# Patient Record
Sex: Female | Born: 1980 | Race: White | Hispanic: No | Marital: Single | Smoking: Never smoker
Health system: Southern US, Community
[De-identification: ages and names within clinical notes are randomized; demographics above are authoritative.]

## PROBLEM LIST (undated history)

## (undated) DIAGNOSIS — Z993 Dependence on wheelchair: Secondary | ICD-10-CM

## (undated) DIAGNOSIS — G709 Myoneural disorder, unspecified: Secondary | ICD-10-CM

## (undated) DIAGNOSIS — R51 Headache: Secondary | ICD-10-CM

## (undated) DIAGNOSIS — K219 Gastro-esophageal reflux disease without esophagitis: Secondary | ICD-10-CM

## (undated) DIAGNOSIS — I739 Peripheral vascular disease, unspecified: Secondary | ICD-10-CM

## (undated) DIAGNOSIS — N189 Chronic kidney disease, unspecified: Secondary | ICD-10-CM

## (undated) DIAGNOSIS — F419 Anxiety disorder, unspecified: Secondary | ICD-10-CM

## (undated) DIAGNOSIS — Q039 Congenital hydrocephalus, unspecified: Secondary | ICD-10-CM

## (undated) DIAGNOSIS — N3090 Cystitis, unspecified without hematuria: Secondary | ICD-10-CM

## (undated) DIAGNOSIS — J45909 Unspecified asthma, uncomplicated: Secondary | ICD-10-CM

## (undated) DIAGNOSIS — Q059 Spina bifida, unspecified: Secondary | ICD-10-CM

## (undated) DIAGNOSIS — R519 Headache, unspecified: Secondary | ICD-10-CM

## (undated) DIAGNOSIS — R569 Unspecified convulsions: Secondary | ICD-10-CM

## (undated) HISTORY — PX: SPINE SURGERY: SHX786

## (undated) HISTORY — PX: WISDOM TOOTH EXTRACTION: SHX21

## (undated) HISTORY — DX: Congenital hydrocephalus, unspecified: Q03.9

## (undated) HISTORY — DX: Spina bifida, unspecified: Q05.9

## (undated) HISTORY — PX: SHUNT REVISION: SHX343

## (undated) HISTORY — PX: TONSILLECTOMY AND ADENOIDECTOMY: SHX28

## (undated) HISTORY — PX: BACK SURGERY: SHX140

## (undated) HISTORY — PX: APPENDECTOMY: SHX54

## (undated) HISTORY — PX: TONSILLECTOMY AND ADENOIDECTOMY: SUR1326

## (undated) HISTORY — PX: VENTRICULOPERITONEAL SHUNT: SHX204

## (undated) HISTORY — DX: Unspecified asthma, uncomplicated: J45.909

## (undated) HISTORY — DX: Anxiety disorder, unspecified: F41.9

## (undated) HISTORY — PX: OTHER SURGICAL HISTORY: SHX169

## (undated) HISTORY — PX: ABDOMINAL HYSTERECTOMY: SHX81

---

## 2001-04-26 ENCOUNTER — Ambulatory Visit (HOSPITAL_COMMUNITY): Admission: RE | Admit: 2001-04-26 | Discharge: 2001-04-26 | Payer: Self-pay | Admitting: Oral Surgery

## 2001-10-24 ENCOUNTER — Encounter: Payer: Self-pay | Admitting: Family Medicine

## 2001-10-24 ENCOUNTER — Ambulatory Visit (HOSPITAL_COMMUNITY): Admission: RE | Admit: 2001-10-24 | Discharge: 2001-10-24 | Payer: Self-pay | Admitting: Family Medicine

## 2005-11-02 ENCOUNTER — Other Ambulatory Visit: Admission: RE | Admit: 2005-11-02 | Discharge: 2005-11-02 | Payer: Self-pay | Admitting: Family Medicine

## 2006-10-12 ENCOUNTER — Other Ambulatory Visit: Admission: RE | Admit: 2006-10-12 | Discharge: 2006-10-12 | Payer: Self-pay | Admitting: Family Medicine

## 2007-04-22 ENCOUNTER — Other Ambulatory Visit: Admission: RE | Admit: 2007-04-22 | Discharge: 2007-04-22 | Payer: Self-pay | Admitting: Family Medicine

## 2007-06-28 ENCOUNTER — Encounter: Admission: RE | Admit: 2007-06-28 | Discharge: 2007-09-26 | Payer: Self-pay | Admitting: Family Medicine

## 2007-10-25 ENCOUNTER — Other Ambulatory Visit: Admission: RE | Admit: 2007-10-25 | Discharge: 2007-10-25 | Payer: Self-pay | Admitting: Family Medicine

## 2008-11-06 ENCOUNTER — Encounter: Admission: RE | Admit: 2008-11-06 | Discharge: 2008-11-06 | Payer: Self-pay | Admitting: Family Medicine

## 2008-11-07 ENCOUNTER — Emergency Department (HOSPITAL_COMMUNITY): Admission: EM | Admit: 2008-11-07 | Discharge: 2008-11-07 | Payer: Self-pay | Admitting: Emergency Medicine

## 2009-06-14 ENCOUNTER — Other Ambulatory Visit: Admission: RE | Admit: 2009-06-14 | Discharge: 2009-06-14 | Payer: Self-pay | Admitting: Family Medicine

## 2009-09-09 ENCOUNTER — Ambulatory Visit (HOSPITAL_COMMUNITY): Admission: RE | Admit: 2009-09-09 | Discharge: 2009-09-10 | Payer: Self-pay | Admitting: Obstetrics and Gynecology

## 2009-09-09 ENCOUNTER — Encounter (INDEPENDENT_AMBULATORY_CARE_PROVIDER_SITE_OTHER): Payer: Self-pay | Admitting: Obstetrics and Gynecology

## 2010-10-26 ENCOUNTER — Encounter: Payer: Self-pay | Admitting: Family Medicine

## 2011-01-06 LAB — TYPE AND SCREEN: ABO/RH(D): B POS

## 2011-01-06 LAB — COMPREHENSIVE METABOLIC PANEL
ALT: 29 U/L (ref 0–35)
AST: 19 U/L (ref 0–37)
Albumin: 3.9 g/dL (ref 3.5–5.2)
Alkaline Phosphatase: 118 U/L — ABNORMAL HIGH (ref 39–117)
BUN: 6 mg/dL (ref 6–23)
CO2: 25 mEq/L (ref 19–32)
Calcium: 9.1 mg/dL (ref 8.4–10.5)
Chloride: 104 mEq/L (ref 96–112)
Creatinine, Ser: 0.36 mg/dL — ABNORMAL LOW (ref 0.4–1.2)
GFR calc Af Amer: >60 mL/min (ref >60)
GFR calc non Af Amer: >60 mL/min (ref >60)
Glucose, Bld: 93 mg/dL (ref 70–99)
Potassium: 3 mEq/L — ABNORMAL LOW (ref 3.5–5.1)
Sodium: 138 mEq/L (ref 135–145)
Total Bilirubin: 0.2 mg/dL — ABNORMAL LOW (ref 0.3–1.2)
Total Protein: 7.8 g/dL (ref 6.0–8.3)

## 2011-01-06 LAB — CBC
HCT: 43.1 % (ref 36.0–46.0)
Hemoglobin: 13.9 g/dL (ref 12.0–15.0)
MCHC: 32.4 g/dL (ref 30.0–36.0)
MCV: 84.4 fL (ref 78.0–100.0)
Platelets: 294 10*3/uL (ref 150–400)
Platelets: 347 10*3/uL (ref 150–400)
RBC: 5.1 MIL/uL (ref 3.87–5.11)
RDW: 14.7 % (ref 11.5–15.5)
RDW: 15 % (ref 11.5–15.5)
WBC: 10.1 10*3/uL (ref 4.0–10.5)
WBC: 10.2 10*3/uL (ref 4.0–10.5)

## 2011-01-06 LAB — PREGNANCY, URINE: Preg Test, Ur: NEGATIVE

## 2011-01-06 LAB — ABO/RH: ABO/RH(D): B POS

## 2011-01-20 LAB — URINALYSIS, ROUTINE W REFLEX MICROSCOPIC
Nitrite: NEGATIVE
Specific Gravity, Urine: 1.01 (ref 1.005–1.030)
Urobilinogen, UA: 0.2 mg/dL (ref 0.0–1.0)
pH: 6.5 (ref 5.0–8.0)

## 2011-02-20 NOTE — Op Note (Signed)
. [facility]  Patient:    Carol Zavala, Carol Zavala                   MRN: [id number] Proc. Date: 04/26/01 Adm. Date:  [id number] Attending:  Retia Passe                           Operative Report  PREOPERATIVE DIAGNOSIS:  Maxillary and mandibular impacted third molar teeth numbers 1, 16, 17 and 32.  History of severe latex allergy.  History of spina bifida.  POSTOPERATIVE DIAGNOSIS:  Maxillary and mandibular impacted third molar teeth numbers 1, 16, 17 and 32.  History of severe latex allergy.  History of spina bifida.  OPERATION PERFORMED:  Removal of impacted third molar teeth numbers 1, 16, 17 and 32.  SURGEON:  Vania Rea. Warren Danes, D.D.S.  ASSISTANT:  Tinnie Gens.  ANESTHESIA:  General orotracheal.  ESTIMATED BLOOD LOSS:  Less than 20 cc.  FLUID REPLACEMENT:  Approximately 1000 cc crystalloid solution.  COMPLICATIONS:  None apparent.  INDICATIONS FOR PROCEDURE:  The patient is a 30 year old who was referred to my office for removal of her third molar teeth.  The patient presents with a history of spinal bifida, cardiothoracic shunt, and a history of severe latex allergy.  Due to her medical history, it was recommended that the procedure be performed in an operating room situation.  DESCRIPTION OF PROCEDURE:  On April 26, 2001, the patient was taken to the [facility] operating suite where she was placed on the operating room table in a supine position.  Following successful orotracheal intubation and general anesthesia, the patients face, neck and oral cavity were prepped and drift in the usual sterile operating room fashion.  The hypopharynx was suctioned free of fluids and secretions and a moistened 2 inch vaginal pack was placed as a throat pack.  Attention was then directed intraorally, where approximately 10 cc of 0.5% Xylocaine containing 1:200,000 epinephrine were infiltrated in the right and left maxillary  posterior superior alveolar nerve distributions, right and left palatal soft tissues and the right and left inferior alveolar neurovascular regions.  Attention was then directed towards the maxillary arches, where a #15 Bard Parker blade was used to create a 1.5 curvilinear incision over tooth #1.  A full thickness mucoperiosteal flap was elevated laterally exposing the imbedded tooth.  The tooth was sectioned in its vertical axes using a Stryker rotary osteotome and a 7-0 fissure bur.  The tooth was then subluxated and removed from the oral cavity using an 11A elevator and rongeurs and forceps.  The bony margins  were smoothed and contoured with a small osseous file and the surrounding dental follicular tissue was curetted using double ended Molt curet.  The surgical area was then thoroughly irrigated with sterile saline irrigating solution and suctioned.  The mucoperiosteal margins were approximated and sutured in an interrupted fashion using 4-0 chromic suture material on a PS2 needle.  In a similar fashion, the remaining third molar teeth were removed as well.  The throat pack was removed, and the hypopharynx suctioned free of fluid and secretions.  The patient was allowed to awaken from the anesthesia and taken to the recovery room where she tolerated the procedure well and without apparent complications. DD:  04/26/01 TD:  04/26/01 Job: [id number]

## 2011-07-10 ENCOUNTER — Other Ambulatory Visit (HOSPITAL_COMMUNITY)
Admission: RE | Admit: 2011-07-10 | Discharge: 2011-07-10 | Disposition: A | Discharge status: Home / Self Care | Payer: BC Managed Care – PPO | Source: Ambulatory Visit | Attending: Obstetrics and Gynecology | Admitting: Obstetrics and Gynecology

## 2011-07-10 ENCOUNTER — Other Ambulatory Visit: Payer: Self-pay | Admitting: Obstetrics and Gynecology

## 2011-07-10 DIAGNOSIS — Z01419 Encounter for gynecological examination (general) (routine) without abnormal findings: Secondary | ICD-10-CM | POA: Insufficient documentation

## 2013-08-16 ENCOUNTER — Ambulatory Visit (INDEPENDENT_AMBULATORY_CARE_PROVIDER_SITE_OTHER): Payer: BC Managed Care – PPO | Admitting: Family Medicine

## 2013-08-16 VITALS — BP 108/70 | HR 110 | Temp 98.2°F | Resp 20

## 2013-08-16 DIAGNOSIS — R51 Headache: Secondary | ICD-10-CM

## 2013-08-16 DIAGNOSIS — R350 Frequency of micturition: Secondary | ICD-10-CM

## 2013-08-16 DIAGNOSIS — R42 Dizziness and giddiness: Secondary | ICD-10-CM

## 2013-08-16 DIAGNOSIS — R000 Tachycardia, unspecified: Secondary | ICD-10-CM

## 2013-08-16 LAB — POCT CBC
HCT, POC: 45.2 % (ref 37.7–47.9)
Hemoglobin: 13.7 g/dL (ref 12.2–16.2)
Lymph, poc: 3.2 (ref 0.6–3.4)
MCH, POC: 27.8 pg (ref 27–31.2)
MCHC: 30.3 g/dL — AB (ref 31.8–35.4)
MCV: 91.6 fL (ref 80–97)
POC LYMPH PERCENT: 31.2 %L (ref 10–50)
WBC: 10.2 10*3/uL (ref 4.6–10.2)

## 2013-08-16 LAB — POCT UA - MICROSCOPIC ONLY
Casts, Ur, LPF, POC: NEGATIVE
Crystals, Ur, HPF, POC: NEGATIVE
Mucus, UA: NEGATIVE

## 2013-08-16 LAB — POCT URINALYSIS DIPSTICK
Glucose, UA: NEGATIVE
Spec Grav, UA: 1.025
Urobilinogen, UA: 0.2
pH, UA: 5

## 2013-08-16 LAB — GLUCOSE, POCT (MANUAL RESULT ENTRY): POC Glucose: 94 mg/dl (ref 70–99)

## 2013-08-16 NOTE — Progress Notes (Addendum)
[address]   [place], [place]  [address]   [phone]  Subjective:    Patient ID: Carol Zavala, female    DOB: [birth year], 32 y.o.   MRN: [id number]  This chart was scribed for Ethelda Chick, MD by Greggory Stallion, Medical Scribe. This patient's care was started at 7:42 PM.  HPI HPI Comments: Carol Zavala is a 32 y.o. female with history of spina bifida who is wheelchair bound who presents to the office complaining of constant sharp, sinus headache with associated nausea, SOB, chest pain, palpitations and dizziness that started yesterday while she was at work. The headache worsened yesterday, she took Excedrin with relief and it returned today. She rates it at 7/10 yesterday and 4/10 today. Pt states she gets SOB during panic attacks but her SOB does not seem the same. She states she checked her blood pressure and it was 188/80 and a high heart rate. Pt checked it again this morning and her blood pressure decreased but her heart rate was still very high of 120. She states she has not been under increased stress recently but works in a call center with persistent stress. Pt states she has leg swelling sometimes, chronically worse in the right leg. She has been urinating frequently and often gets UTIs; she performs self caths at home. Pt denies emesis, numbness, tingling, weakness, ear pain, sore throat, fever, chills, sweats, blurred vision, double vision, rhinorrhea, congestion, mouth sores, neck pain, cough. Pt has not taken any new medications recently. Father has history of diabetes and irregular heartbeat. Mother has history of HTN.   No family history of strokes; patient has friend who just suffered a CVA.  Attempted to contact PCP but unable to provide patient with appointment today.  Most worried about rapid heart rate.  Headache improved today.  There are no active problems to display for this patient.  Past Medical History  Diagnosis Date   Asthma    Anxiety    Past  Surgical History  Procedure Laterality Date   Appendectomy     Eye surgery     Abdominal hysterectomy     Spine surgery     Tonsillectomy and adenoidectomy     Allergies  Allergen Reactions   Latex Anaphylaxis   Neomycin Anaphylaxis   Ciprofloxacin Other (See Comments)   Suprax [Cefixime] Other (See Comments)   Septra [Sulfamethoxazole-Tmp Ds] Rash   Prior to Admission medications   Not on File   History   Social History   Marital Status: Single    Spouse Name: N/A    Number of Children: N/A   Years of Education: N/A   Occupational History   Not on file.   Social History Main Topics   Smoking status: Never Smoker    Smokeless tobacco: Not on file   Alcohol Use: 0.0 oz/week    4-5 drink(s) per week   Drug Use: No   Sexual Activity: Not on file   Other Topics Concern   Not on file   Social History Narrative   No narrative on file    Review of Systems  Constitutional: Negative for fever, chills and diaphoresis.  HENT: Negative for congestion, ear pain, mouth sores, rhinorrhea and sore throat.   Eyes: Negative for visual disturbance.  Respiratory: Positive for shortness of breath.   Cardiovascular: Positive for chest pain, palpitations and leg swelling.  Gastrointestinal: Positive for nausea. Negative for vomiting.  Genitourinary: Positive for frequency.  Musculoskeletal: Negative for neck  pain.  Neurological: Positive for dizziness and headaches. Negative for weakness and numbness.       Objective:   Physical Exam  Constitutional: She is oriented to person, place, and time. She appears well-developed and well-nourished. No distress.  HENT:  Head: Normocephalic and atraumatic.  Right Ear: Tympanic membrane and ear canal normal.  Left Ear: Tympanic membrane and ear canal normal.  Nose: Nose normal.  Mouth/Throat: Uvula is midline, oropharynx is clear and moist and mucous membranes are normal.  Eyes: Conjunctivae and EOM are normal.  Pupils are equal, round, and reactive to light.  Neck: Neck supple. No tracheal deviation present.  Cardiovascular: Normal rate, regular rhythm and normal heart sounds.   No murmur heard. Rate 98.  Pulmonary/Chest: Effort normal. No respiratory distress.  Abdominal: Soft. Bowel sounds are normal. She exhibits no distension and no mass. There is no tenderness. There is no rebound and no guarding.  Neurological: She is alert and oriented to person, place, and time.  Skin: Skin is warm and dry.  Psychiatric: She has a normal mood and affect. Her behavior is normal.    Filed Vitals:   08/16/13 1905  BP: 116/82  Pulse: 110  Temp: 98.2 F (36.8 C)  TempSrc: Oral  Resp: 20  SpO2: 100%   Results for orders placed in visit on 08/16/13  POCT CBC      Result Value Range   WBC 10.2  4.6 - 10.2 K/uL   Lymph, poc 3.2  0.6 - 3.4   POC LYMPH PERCENT 31.2  10 - 50 %L   MID (cbc) 0.6  0 - 0.9   POC MID % 5.6  0 - 12 %M   POC Granulocyte 6.4  2 - 6.9   Granulocyte percent 63.2  37 - 80 %G   RBC 4.93  4.04 - 5.48 M/uL   Hemoglobin 13.7  12.2 - 16.2 g/dL   HCT, POC 30.8  65.7 - 47.9 %   MCV 91.6  80 - 97 fL   MCH, POC 27.8  27 - 31.2 pg   MCHC 30.3 (*) 31.8 - 35.4 g/dL   RDW, POC 84.6     Platelet Count, POC 332  142 - 424 K/uL   MPV 9.5  0 - 99.8 fL  GLUCOSE, POCT (MANUAL RESULT ENTRY)      Result Value Range   POC Glucose 94  70 - 99 mg/dl  POCT UA - MICROSCOPIC ONLY      Result Value Range   WBC, Ur, HPF, POC 5-8     RBC, urine, microscopic 2-4     Bacteria, U Microscopic small     Mucus, UA neg     Epithelial cells, urine per micros 1-3     Crystals, Ur, HPF, POC neg     Casts, Ur, LPF, POC neg     Yeast, UA neg    POCT URINALYSIS DIPSTICK      Result Value Range   Color, UA yellow     Clarity, UA clear     Glucose, UA neg     Bilirubin, UA neg     Ketones, UA trace     Spec Grav, UA 1.025     Blood, UA mod     pH, UA 5.0     Protein, UA neg     Urobilinogen, UA 0.2      Nitrite, UA neg     Leukocytes, UA Trace     EKG: sinus tachycardia  at 107.  No ST changes.      Assessment & Plan:   Headache(784.0) - Plan: POCT CBC, POCT glucose (manual entry), Comprehensive metabolic panel, EKG 12-Lead  Dizziness - Plan: POCT CBC, POCT glucose (manual entry), Comprehensive metabolic panel, EKG 12-Lead  Tachycardia - Plan: Comprehensive metabolic panel, EKG 12-Lead  Urinary frequency - Plan: POCT UA - Microscopic Only, POCT urinalysis dipstick, Urine culture  1.  Headache:  New.  Improved over the past 24 hours.  No concerning features to headache at this time.  RTC for acute worsening. 2.  Dizziness:  New.  Obtain labs; normal glucose in office; no evidence of anemia.  3. Tachycardia:  New. Mild; stable EKG with sinus tachy; no arrhythmia noted.  Obtain labs. 4. Urinary frequency:  Ne.  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  Send urine culture.  5. Elevated blood pressure:  New.  Normal blood pressure in office today; recommend checking BP daily for the next two weeks.  RTC for persistent elevation; may have been a stress response versus elevation due to acute headache.   6. Stress reaction:  New.  Significant stressors at work which may be contributing to headache, dizziness, tachycardia.  Close follow-up if symptoms worsen however.  I personally performed the services described in this documentation, which was scribed in my presence.  The recorded information has been reviewed and is accurate.  Nilda Simmer, M.D.  [facility] [address] [place], [place]  [address] [phone] phone [phone] fax

## 2013-08-17 LAB — COMPREHENSIVE METABOLIC PANEL
Albumin: 4.5 g/dL (ref 3.5–5.2)
Alkaline Phosphatase: 129 U/L — ABNORMAL HIGH (ref 39–117)
BUN: 13 mg/dL (ref 6–23)
Calcium: 9.7 mg/dL (ref 8.4–10.5)
Creat: 0.57 mg/dL (ref 0.50–1.10)
Glucose, Bld: 108 mg/dL — ABNORMAL HIGH (ref 70–99)
Potassium: 3.6 mEq/L (ref 3.5–5.3)

## 2013-08-18 LAB — URINE CULTURE

## 2014-02-20 ENCOUNTER — Encounter: Payer: Self-pay | Admitting: Neurology

## 2014-02-20 ENCOUNTER — Ambulatory Visit (INDEPENDENT_AMBULATORY_CARE_PROVIDER_SITE_OTHER): Payer: BC Managed Care – PPO | Admitting: Neurology

## 2014-02-20 VITALS — BP 110/76 | HR 100 | Resp 12

## 2014-02-20 DIAGNOSIS — R569 Unspecified convulsions: Secondary | ICD-10-CM

## 2014-02-20 DIAGNOSIS — G919 Hydrocephalus, unspecified: Secondary | ICD-10-CM

## 2014-02-20 DIAGNOSIS — Q059 Spina bifida, unspecified: Secondary | ICD-10-CM

## 2014-02-20 DIAGNOSIS — G911 Obstructive hydrocephalus: Secondary | ICD-10-CM

## 2014-02-20 MED ORDER — LEVETIRACETAM 500 MG PO TABS: ORAL_TABLET | ORAL | Status: DC

## 2014-02-20 NOTE — Progress Notes (Signed)
NEUROLOGY CONSULTATION NOTE  Carol CowperHeather P Zavala MRN: [id number] DOB: Mar 13, 1981  Referring provider: Elvera LennoxScott Gurley, PA-C Primary care provider: Dr. Merri Brunetteandace Smith  Reason for consult:  seizures  Thank you for your kind referral of Carol CowperHeather P Zavala for consultation of the above symptoms. Although her history is well known to you, please allow me to reiterate it for the purpose of our medical record. The patient was accompanied to the clinic by her father who also provides collateral information. Records and images were personally reviewed where available.  HISTORY OF PRESENT ILLNESS: This is a pleasant 33 year old left-handed woman with a history of congenital hydrocephalus and spina bifida s/p VP shunt in infancy and several revisions, presenting with 2 nocturnal convulsions that occurred in 2011 and most recently last 02/12/2014.  Both were witnessed by her husband who is not present today.  In 2011, they were asleep when she started having whole body shaking.  Her husband called her father who lives 20 minutes away, told him she was snoring loudly and could not be aroused.  When he arrived, she was sitting up, out of it and confused.  She had seen neurologist Dr. Kelli HopeMichael Reynolds, records unavailable for review.  According to the patient, she was unable to complete the MRI brain but the partial results were "okay."  Her routine EEG was reported normal.  She was not started on medication at that time and per patient, a sleep study was considered.  There were no further similar symptoms until last week again out of sleep when her husband woke up to whole body shaking, drooling and foaming at the mouth.  She was confused after and did not know his name, saying "leave me alone," fighting him.  Her father arrived 20 minutes later and found her sitting up in the bed, looking around, no focal arm weakness noted.  She bit both sides of her tongue.  Her husband reported the episode lasted 15 minutes and  was worse than the last one.  Since then, she has had shooting pain on the left side of her neck where the shunt runs.  She feels like she has a "permanent buzz, like I had 2 to 3 mixed drinks." Her short-term memory has been affected. She has also noticed that she has been dropping things, mostly with her right hand.  Even if she is getting good rest, she still feels tired and diffusely weak.  She has "always" had headaches, no change in pattern of frontal pressure, with no associated nausea, vomiting, photo/phonophobia, vision changes.    She and her father deny any staring/unresponsive episodes, gaps in time, olfactory/gustatory hallucinations, deja vu, rising epigastric sensation, focal numbness/tingling, myoclonic jerks. She denies any recent infections or travel.  With the 2 seizures, she recalls having 1 drink the night prior to each one.  She had poor sleep the nights prior to the recent seizure.  She also noticed that 2 days prior to the recent seizure, she felt that she could not loosen her jaw.  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  She denies any diplopia, dysarthria, back pain.  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 No change from baseline.  She initially had a VP shunt on the right, then had several revisions due  to shunt infection, last shunt placed on the left.    Epilepsy Risk Factors:  Congenital hydrocephalus s/p VP shunt.  There is no history of febrile convulsions, CNS infections such as meningitis/encephalitis, significant traumatic brain injury, or family history of seizures.  Laboratory Data: 02/14/14 CBC and CMP normal. 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 There is a shunt catheter seen within the left lateral ventricle entering the posterior aspect of the left parietal bone. The ventricles are normal in caliber.  There has been a previous right parietal craniotomy where there is some adjacent right parietal encephalomalacia.  There is diffuse thickening of the calvarium. No acute abnormality seen.  PAST MEDICAL HISTORY: Past Medical History  Diagnosis Date  . Asthma   . Anxiety   . Hydrocephalus, congenital   . Spina bifida     PAST SURGICAL HISTORY: Past Surgical History  Procedure Laterality Date  . Appendectomy    . Eye surgery    . Abdominal hysterectomy    . Spine surgery    . Tonsillectomy and adenoidectomy    . Ventriculoperitoneal shunt Left shortly after birth    had several revisions    MEDICATIONS: Doxycycline   No current facility-administered medications on file prior to visit.    ALLERGIES: Allergies  Allergen Reactions  . Latex Anaphylaxis  . Neomycin Anaphylaxis  . Ciprofloxacin Other (See Comments)  . Suprax [Cefixime] Other (See Comments)  . Septra [Sulfamethoxazole-Tmp Ds] Rash    FAMILY HISTORY: Family History  Problem Relation Age of Onset  . Hyperlipidemia Mother   . Diabetes Father   . Heart disease Father   . Hyperlipidemia Father   . Cancer Maternal Grandmother     BREAST AND UTERINE  . Cancer Maternal Grandfather     LUNG  . Cancer Paternal Grandmother     LUNG  . Cancer Paternal Grandfather     LUNG    SOCIAL HISTORY: History   Social History  . Marital Status: Single    Spouse Name: N/A    Number of Children: N/A  . Years of Education: N/A   Occupational History  . Not on file.   Social History Main Topics  . Smoking status: Never Smoker   . Smokeless tobacco: Not on file  . Alcohol Use: 0.0 oz/week    4-5 drink(s) per week  . Drug Use: No  . Sexual Activity: Not on file   Other Topics Concern  . Not on file   Social  History Narrative  . No narrative on file    REVIEW OF SYSTEMS: Constitutional: No fevers, chills, or sweats, no generalized fatigue, change in appetite Eyes: No visual changes, double vision, eye pain Ear, nose and throat: No hearing loss, ear pain, nasal congestion, sore throat Cardiovascular: occasional chest pain, no palpitations Respiratory:  No shortness of breath at rest or with exertion, wheezes GastrointestinaI: No nausea, vomiting, diarrhea, abdominal pain, fecal incontinence Genitourinary:  No dysuria, baseline urinary retention  Musculoskeletal:  + neck pain, no back pain Integumentary: No rash, pruritus, axillary boil on doxycycline Neurological: as above Psychiatric: No depression, insomnia, anxiety Endocrine: No palpitations, fatigue, diaphoresis, mood swings, change in appetite, change in weight, increased thirst Hematologic/Lymphatic:  No anemia, purpura, petechiae. Allergic/Immunologic: no itchy/runny eyes, nasal congestion, recent allergic reactions, rashes  PHYSICAL EXAM: Filed Vitals:   02/20/14 0747  BP: 110/76  Pulse:  100  Resp: 12   General: No acute distress, sitting on wheelchair Head:  Normocephalic/atraumatic Eyes: left esotropia. Unable to visualize fundi but no vessel changes, exudates, or hemorrhages seen Neck: supple, no paraspinal tenderness, full range of motion Back: No paraspinal tenderness Heart: regular rate and rhythm Lungs: Clear to auscultation bilaterally. Vascular: No carotid bruits. Skin/Extremities: No rash, bipedal edema, bilateral club feet Neurological Exam: Mental status: alert and oriented to person, place, and time, no dysarthria or aphasia, Fund of knowledge is appropriate.  Recent and remote memory are intact.  Attention and concentration are normal.    Able to name objects and repeat phrases. Cranial nerves: CN I: not tested CN II: pupils equal, round and reactive to light, visual fields intact, fundi unremarkable. CN III,  IV, VI:  full range of motion, no nystagmus, no ptosis CN V: facial sensation intact CN VII: upper and lower face symmetric CN VIII: hearing intact to finger rub CN IX, X: gag intact, uvula midline CN XI: sternocleidomastoid and trapezius muscles intact CN XII: tongue midline Bulk & Tone: normal on both UE, hypotonic on both LE, no fasciculations. Motor: 5/5 on both UE. 0/5 on both LE Sensation: intact to light touch, cold, pin on both UE.  Absent to all modalities on both LE.   Deep Tendon Reflexes: +2 on both UE, absent reflexes on both LE.  No ankle clonus Plantar responses: mute bilaterally Cerebellar: no incoordination on finger to nose Gait: not tested, paraplegic Tremor: none  IMPRESSION: This is a pleasant 33 year old left-handed woman with a history of congenital hydrocephalus and spina bifida s/p VP shunt, presenting with her second nocturnal seizure.  The first seizure occurred in 2011.  They deny any seizures or seizure-like symptoms until 02/12/2014.  We discussed that patients with a single unprovoked seizure have a recurrence rate of 33% after a single seizure and 73% after a second seizure.  Since this is the second seizure, I would recommend starting an AED.  We discussed options, she will start low dose Keppra 250mg  BID x 1 week, then increase to 500mg  BID.  Side effects were discussed.  She is reporting shooting left neck pain in the region of her shunt since the seizure, a shunt series will be ordered.  She also reports right hand weakness, exam today shows good strength.  An MRI brain with and without contrast and routine EEG will be ordered. Records from her previous neurologist will be requested for review.  If she continues to feel the "buzzed sensation" after 2 weeks of starting AED, prolonged EEG will be considered to assess for subclinical seizures.  We discussed [place] driving restrictions which indicate a patient needs to free of seizures or events of altered awareness for 6  months prior to resuming driving. She does not drive.  We discussed seizure triggers, including missing medications, alcohol, and sleep deprivation. She knows to avoid these triggers.  She will follow-up in 1 month.    Thank you for allowing me to participate in the care of this patient. Please do not hesitate to call for any questions or concerns.   Patrcia Dolly, M.D.  CC: Elvera Lennox, PA-C

## 2014-02-20 NOTE — Patient Instructions (Signed)
1. Shunt series 2. MRI brain with and without contrast 3. Routine EEG 4. Keppra 500mg : 1/2 tab twice a day for 1 week, then 1 tab twice a day  Seizure Precautions: 1. If medication has been prescribed for you to prevent seizures, take it exactly as directed.  Do not stop taking the medicine without talking to your doctor first, even if you have not had a seizure in a long time.   2. Avoid activities in which a seizure would cause danger to yourself or to others.  Don't operate dangerous machinery, swim alone, or climb in high or dangerous places, such as on ladders, roofs, or girders.  Do not drive unless your doctor says you may.  3. If you have any warning that you may have a seizure, lay down in a safe place where you can't hurt yourself.    4.  No driving for 6 months from last seizure, as per [facility] [place].   Please refer to the following link on the Epilepsy Foundation of America's website for more information: http://www.epilepsyfoundation.org/answerplace/Social/driving/drivingu.cfm   5.  Maintain good sleep hygiene.  6.  Notify your neurology if you are planning pregnancy or if you become pregnant.  7.  Contact your doctor if you have any problems that may be related to the medicine you are taking.  8.  Call 911 and bring the patient back to the ED if:        A.  The seizure lasts longer than 5 minutes.       B.  The patient doesn't awaken shortly after the seizure  C.  The patient has new problems such as difficulty seeing, speaking or moving  D.  The patient was injured during the seizure  E.  The patient has a temperature over 102 F (39C)  F.  The patient vomited and now is having trouble breathing

## 2014-03-07 ENCOUNTER — Ambulatory Visit (INDEPENDENT_AMBULATORY_CARE_PROVIDER_SITE_OTHER): Payer: BC Managed Care – PPO | Admitting: Neurology

## 2014-03-07 ENCOUNTER — Encounter: Payer: Self-pay | Admitting: Neurology

## 2014-03-07 DIAGNOSIS — G911 Obstructive hydrocephalus: Secondary | ICD-10-CM

## 2014-03-07 DIAGNOSIS — G919 Hydrocephalus, unspecified: Secondary | ICD-10-CM

## 2014-03-07 DIAGNOSIS — R569 Unspecified convulsions: Secondary | ICD-10-CM

## 2014-03-08 ENCOUNTER — Ambulatory Visit (HOSPITAL_COMMUNITY)
Admission: RE | Admit: 2014-03-08 | Discharge: 2014-03-08 | Disposition: A | Discharge status: Home / Self Care | Payer: BC Managed Care – PPO | Source: Ambulatory Visit | Attending: Neurology | Admitting: Neurology

## 2014-03-08 ENCOUNTER — Telehealth: Payer: Self-pay | Admitting: Neurology

## 2014-03-08 ENCOUNTER — Encounter: Payer: Self-pay | Admitting: Neurology

## 2014-03-08 ENCOUNTER — Ambulatory Visit (HOSPITAL_COMMUNITY): Admission: RE | Admit: 2014-03-08 | Payer: BC Managed Care – PPO | Source: Ambulatory Visit

## 2014-03-08 DIAGNOSIS — Z982 Presence of cerebrospinal fluid drainage device: Secondary | ICD-10-CM | POA: Diagnosis not present

## 2014-03-08 DIAGNOSIS — G911 Obstructive hydrocephalus: Secondary | ICD-10-CM | POA: Insufficient documentation

## 2014-03-08 DIAGNOSIS — R569 Unspecified convulsions: Secondary | ICD-10-CM | POA: Diagnosis not present

## 2014-03-08 DIAGNOSIS — G919 Hydrocephalus, unspecified: Secondary | ICD-10-CM

## 2014-03-08 MED ORDER — LEVETIRACETAM ER 500 MG PO TB24: 500.0000 mg | ORAL_TABLET | Freq: Every day | ORAL | Status: DC

## 2014-03-08 NOTE — Telephone Encounter (Signed)
Spoke to patient. Discussed EEG and shunt series results, both normal. No evidence of jaw fracture on xray. She has the jaw pain mainly when waking up in the morning. She does grind teeth and clench jaws in sleep, has mouthguard. Takes Tylenol.  She feels too drowsy on 500mg  BID Keppra, still on 250mg  BID.  Will switch to Keppra XR 500mg  qhs and plan for slow uptitration as tolerated, Rx sent to pharmacy.  She reports that she is mostly feeling back to baseline except for jaw pain and occasional difficulty finding a word, occurring daily. She will switch to Keppra XR, if still having symptoms in a week, would go ahead with 24-hour EEG. Patient will call to update.

## 2014-03-08 NOTE — Progress Notes (Signed)
Patient came in for EEG. See Procedure notes for EEG results.  

## 2014-03-08 NOTE — Procedures (Signed)
ELECTROENCEPHALOGRAM REPORT  Date of Study: 03/07/2014  Patient's Name: Carol Zavala MRN: [id number] Date of Birth: 08/03/1981  Referring Provider: Dr. Patrcia Dolly  Clinical History:This is a 33 year old woman with a history of congenital hydrocephalus and spina bifida s/p VP shunt, presenting with her second nocturnal seizure. The first seizure occurred in 2011. They deny any seizures or seizure-like symptoms until 02/12/2014.   Medications: Keppra  Technical Summary: A multichannel digital EEG recording measured by the international 10-20 system with electrodes applied with paste and impedances below 5000 ohms performed in our laboratory with EKG monitoring in an awake and drowsy patient.  Hyperventilation was not performed. Photic stimulation was performed.  The digital EEG was referentially recorded, reformatted, and digitally filtered in a variety of bipolar and referential montages for optimal display.  Spike detection software was employed.  Description: The patient is awake and drowsy during the recording.  During maximal wakefulness, there is a symmetric, medium voltage 10 Hz posterior dominant rhythm that attenuates with eye opening.  The record is symmetric.  During drowsiness, there is an increase in theta slowing of the background.  Photic stimulation did not elicit any abnormalities.  There were no epileptiform discharges or electrographic seizures seen.    EKG lead was unremarkable.  Impression: This awake and drowsy EEG is normal.    Clinical Correlation: A normal EEG does not exclude a clinical diagnosis of epilepsy.  If further clinical questions remain, prolonged EEG may be helpful.  Clinical correlation is advised.   Patrcia Dolly, M.D.

## 2014-03-10 ENCOUNTER — Encounter: Payer: Self-pay | Admitting: Neurology

## 2014-03-26 ENCOUNTER — Ambulatory Visit: Payer: BC Managed Care – PPO | Admitting: Neurology

## 2014-04-10 ENCOUNTER — Encounter: Payer: Self-pay | Admitting: Neurology

## 2014-04-10 ENCOUNTER — Ambulatory Visit (INDEPENDENT_AMBULATORY_CARE_PROVIDER_SITE_OTHER): Payer: BC Managed Care – PPO | Admitting: Neurology

## 2014-04-10 VITALS — BP 110/76 | HR 94

## 2014-04-10 DIAGNOSIS — R569 Unspecified convulsions: Secondary | ICD-10-CM

## 2014-04-10 DIAGNOSIS — G919 Hydrocephalus, unspecified: Secondary | ICD-10-CM

## 2014-04-10 DIAGNOSIS — G911 Obstructive hydrocephalus: Secondary | ICD-10-CM

## 2014-04-10 DIAGNOSIS — Q059 Spina bifida, unspecified: Secondary | ICD-10-CM

## 2014-04-10 NOTE — Patient Instructions (Signed)
1. Continue Keppra XR 500 mg at bedtime 2. If you are interested in switching to Lamotrigine due to Keppra side effects, pls call our office 3. We will follow-up on MRI, and if unable to do, we will order a CT scan 4. Call our office for any problems

## 2014-04-10 NOTE — Progress Notes (Signed)
NEUROLOGY FOLLOW UP OFFICE NOTE  Carol CowperHeather P Zavala [id number]  HISTORY OF PRESENT ILLNESS: I had the pleasure of seeing Carol Zavala  in follow-up in the neurology clinic on 04/10/2014.  The patient was last seen 1 month ago after a nocturnal convulsion on 02/12/2014.  Her routine EEG is normal.  We are still awaiting clearance for MRI due to possible metal in her back.  She started Keppra and felt too sleepy on immediate release form. She switched to Keppra XR 500mg  qhs, and reports that the daytime drowsiness is much improved. She takes it at 8pm and this helps her sleep.  She had some neck pain after the seizure, shunt series was normal. She reports neck pain has resolved. She occasionally has headaches that resolve spontaneously, usually triggered by prolonged computer use at work.  She reports the "fuzziness" is better however she has had 3-4 instances where she cannot remember a simple thing, such as an extension she is very familiar with at work.  No confusion. She does get grouchy since starting the Keppra, her husband may notice this more.    HPI:  This is a pleasant 33 yo LH woman with a history of congenital hydrocephalus and spina bifida s/p VP shunt in infancy and several revisions, with a history of 2 nocturnal convulsions that occurred in 2011 and most recently last 02/12/2014. Both were witnessed by her husband. In 2011, they were asleep when she started having whole body shaking. Her husband called her father who lives 20 minutes away, told him she was snoring loudly and could not be aroused. When he arrived, she was sitting up, out of it and confused. She had seen neurologist Dr. Kelli HopeMichael Reynolds, records unavailable for review. According to the patient, she was unable to complete the MRI brain but the partial results were "okay." Her routine EEG was reported normal. She was not started on medication at that time and per patient, a sleep study was considered. There were no further  similar symptoms until May 2015, out of sleep when her husband woke up to whole body shaking, drooling and foaming at the mouth. She was confused after and did not know his name, saying "leave me alone," fighting him. Her father arrived 20 minutes later and found her sitting up in the bed, looking around, no focal arm weakness noted. She bit both sides of her tongue. Her husband reported the episode lasted 15 minutes and was worse than the last one. She recalls having 1 alcoholic drink the night prior to the seizures.  She has "always" had headaches, no change in pattern of frontal pressure, with no associated nausea, vomiting, photo/phonophobia, vision changes. She and her father deny any staring/unresponsive episodes, gaps in time, olfactory/gustatory hallucinations, deja vu, rising epigastric sensation, focal numbness/tingling, myoclonic jerks. 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 No change from baseline. She initially had a VP shunt on the right, then had several revisions due to shunt infection, last shunt placed on the left.   Epilepsy Risk Factors: Congenital hydrocephalus s/p VP shunt. There is no history of febrile convulsions, CNS infections such as meningitis/encephalitis, significant traumatic brain injury, or family history of seizures.   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 There is a shunt catheter seen within the left lateral ventricle  entering the posterior aspect of the left parietal bone. The ventricles are normal in caliber. There has been a previous right parietal craniotomy where there is some adjacent right parietal encephalomalacia. There is diffuse thickening of the calvarium. No acute abnormality seen. Routine EEG 03/2014: normal wake and  drowsy EEG  PAST MEDICAL HISTORY: Past Medical History  Diagnosis Date  . Asthma   . Anxiety   . Hydrocephalus, congenital   . Spina bifida     MEDICATIONS: Current Outpatient Prescriptions on File Prior to Visit  Medication Sig Dispense Refill  . doxycycline (VIBRAMYCIN) 100 MG capsule Take 100 mg by mouth 2 (two) times daily.      Marland Kitchen levETIRAcetam (KEPPRA XR) 500 MG 24 hr tablet Take 1 tablet (500 mg total) by mouth at bedtime.  30 tablet  6   No current facility-administered medications on file prior to visit.    ALLERGIES: Allergies  Allergen Reactions  . Latex Anaphylaxis  . Neomycin Anaphylaxis  . Ciprofloxacin Other (See Comments)  . Suprax [Cefixime] Other (See Comments)  . Septra [Sulfamethoxazole-Tmp Ds] Rash    FAMILY HISTORY: Family History  Problem Relation Age of Onset  . Hyperlipidemia Mother   . Diabetes Father   . Heart disease Father   . Hyperlipidemia Father   . Cancer Maternal Grandmother     BREAST AND UTERINE  . Cancer Maternal Grandfather     LUNG  . Cancer Paternal Grandmother     LUNG  . Cancer Paternal Grandfather     LUNG    SOCIAL HISTORY: History   Social History  . Marital Status: Single    Spouse Name: N/A    Number of Children: N/A  . Years of Education: N/A   Occupational History  . Not on file.   Social History Main Topics  . Smoking status: Never Smoker   . Smokeless tobacco: Not on file  . Alcohol Use: 0.0 oz/week    4-5 drink(s) per week  . Drug Use: No  . Sexual Activity: Not on file   Other Topics Concern  . Not on file   Social History Narrative  . No narrative on file    REVIEW OF SYSTEMS: Constitutional: No fevers, chills, or sweats, no generalized fatigue, change in appetite Eyes: No visual changes, double vision, eye pain Ear, nose and throat: No hearing loss, ear pain, nasal congestion, sore throat Cardiovascular: No chest pain, palpitations Respiratory:  No shortness of breath at rest or with  exertion, wheezes GastrointestinaI: No nausea, vomiting, diarrhea, abdominal pain, fecal incontinence Genitourinary:  No dysuria, urinary retention or frequency Musculoskeletal:  No neck pain, back pain Integumentary: No rash, pruritus, skin lesions Neurological: as above Psychiatric: No depression, insomnia, anxiety Endocrine: No palpitations, fatigue, diaphoresis, mood swings, change in appetite, change in weight, increased thirst Hematologic/Lymphatic:  No anemia, purpura, petechiae. Allergic/Immunologic: no itchy/runny eyes, nasal congestion, recent allergic reactions, rashes  PHYSICAL EXAM: Filed Vitals:   04/10/14 0954  BP: 110/76  Pulse: 94   General: No acute distress Head:  Normocephalic/atraumatic Neck: supple, no paraspinal tenderness, full range of motion Heart:  Regular rate and rhythm Lungs:  Clear to auscultation bilaterally Back: No paraspinal tenderness Skin/Extremities: No rash, no edema Neurological Exam:Mental status: alert and oriented to person, place, and time, no dysarthria or aphasia, Fund of knowledge is appropriate. Recent and remote memory are intact. Attention and concentration are normal. Able to name objects and repeat phrases.  Cranial nerves:  CN I: not tested  CN  II: pupils equal, round and reactive to light, visual fields intact, fundi unremarkable.  CN III, IV, VI: left esotropia (chronic) with full range of motion, no nystagmus, no ptosis  CN V: facial sensation intact  CN VII: upper and lower face symmetric  CN VIII: hearing intact to finger rub  CN IX, X: gag intact, uvula midline  CN XI: sternocleidomastoid and trapezius muscles intact  CN XII: tongue midline  Bulk & Tone: normal on both UE, hypotonic on both LE, no fasciculations.  Motor: 5/5 on both UE. 0/5 on both LE  Sensation: intact to light touch on both UE. Absent sensation on both LE.  Deep Tendon Reflexes: +2 on both UE, absent reflexes on both LE. No ankle clonus  Plantar  responses: mute bilaterally  Cerebellar: no incoordination on finger to nose  Gait: not tested, paraplegic  Tremor: none  IMPRESSION: This is a pleasant 33 yo LH woman with a history of congenital hydrocephalus and spina bifida s/p VP shunt, who had 2 nocturnal seizures in 2011, then recently had another nocturnal seizure last 02/12/2014.  Her routine EEG is normal, brain imaging pending.  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  She has had some drowsiness and irritability on Keppra XR 500mg  qhs. This is a low dose and I would like to further increase dosage, however she will discuss mood issues first with her husband.  If side effects very bothersome, we discussed switching to a different AED such as Lamotrigine.  Side effects, including Levonne SpillerStevens Johnson syndrome were discussed.  She will call our office to let us know their decision.  She does not drive. No pregnancy plans, she had a hysterectomy. She will follow-up in 3 months.   Thank you for allowing me to participate in her care.  Please do not hesitate to call for any questions or concerns.  The duration of this appointment visit was 15 minutes of face-to-face time with the patient.  Greater than 50% of this time was spent in counseling, explanation of diagnosis, planning of further management, and coordination of care.   Patrcia DollyKaren Tylor Courtwright, M.D.   CC: Dr. Katrinka BlazingSmith

## 2014-05-01 ENCOUNTER — Telehealth: Payer: Self-pay | Admitting: Neurology

## 2014-05-01 ENCOUNTER — Encounter: Payer: Self-pay | Admitting: Neurology

## 2014-05-01 DIAGNOSIS — R569 Unspecified convulsions: Secondary | ICD-10-CM

## 2014-05-01 MED ORDER — ZONISAMIDE 100 MG PO CAPS: ORAL_CAPSULE | ORAL | Status: DC

## 2014-05-01 NOTE — Telephone Encounter (Signed)
Patient reporting more headaches since she started Keppra, has noticed the HAs worse after she takes night dose or when medication wearing off in the morning.  Headaches are daily, had to stay home today and took Excedrin. Discussed options, Lamictal versus Zonegran. She would like to try Zonegran. Side effects discussed, monitor for rash due to sulfa allergy listed, she knows to call our office for any problems.She will continue to take Keppra until Zonegran at therapeutic dose. Discussed rebound HAs, she knows to minimize rescue to 2-3/wk.

## 2014-05-09 ENCOUNTER — Encounter: Payer: Self-pay | Admitting: Neurology

## 2014-05-15 ENCOUNTER — Encounter: Payer: Self-pay | Admitting: Neurology

## 2014-05-17 ENCOUNTER — Ambulatory Visit (INDEPENDENT_AMBULATORY_CARE_PROVIDER_SITE_OTHER): Payer: BC Managed Care – PPO | Admitting: Emergency Medicine

## 2014-05-17 VITALS — BP 100/60 | HR 108 | Temp 98.3°F | Resp 16

## 2014-05-17 DIAGNOSIS — J069 Acute upper respiratory infection, unspecified: Secondary | ICD-10-CM

## 2014-05-17 MED ORDER — PROMETHAZINE-CODEINE 6.25-10 MG/5ML PO SYRP: 5.0000 mL | ORAL_SOLUTION | Freq: Four times a day (QID) | ORAL | Status: DC | PRN

## 2014-05-17 MED ORDER — PSEUDOEPHEDRINE-GUAIFENESIN ER 60-600 MG PO TB12: 1.0000 | ORAL_TABLET | Freq: Two times a day (BID) | ORAL | Status: DC

## 2014-05-17 NOTE — Progress Notes (Signed)
[facility] [address], [place] [address] [phone]  Date:  05/17/2014   Name:  Carol Zavala   DOB:  1981-07-08   MRN:  [id number]  PCP:  Allean Found, MD    Chief Complaint: Nasal Congestion and Shortness of Breath   History of Present Illness:  Carol Zavala is a 33 y.o. very pleasant female patient who presents with the following:  3 day history of nasal congestion and post nasal drainage.  Has watery nasal drainage. No fever or chills. Now has a cough with spells of wheezing and post tussive shortness of breath cough productive of mucoid sputum. Uses a nebulizer at home.   Concerned about skipped beats. Improves cough and sputum production with albuterol No improvement with over the counter medications or other home remedies. Denies other complaint or health concern today.   Patient Active Problem List   Diagnosis Date Noted  . Seizures 02/20/2014  . Hydrocephalus 02/20/2014  . Spina bifida 02/20/2014    Past Medical History  Diagnosis Date  . Asthma   . Anxiety   . Hydrocephalus, congenital   . Spina bifida     Past Surgical History  Procedure Laterality Date  . Appendectomy    . Eye surgery    . Abdominal hysterectomy    . Spine surgery    . Tonsillectomy and adenoidectomy    . Ventriculoperitoneal shunt Left shortly after birth    had several revisions    History  Substance Use Topics  . Smoking status: Never Smoker   . Smokeless tobacco: Not on file  . Alcohol Use: 0.0 oz/week    4-5 drink(s) per week    Family History  Problem Relation Age of Onset  . Hyperlipidemia Mother   . Diabetes Father   . Heart disease Father   . Hyperlipidemia Father   . Cancer Maternal Grandmother     BREAST AND UTERINE  . Cancer Maternal Grandfather     LUNG  . Cancer Paternal Grandmother     LUNG  . Cancer Paternal Grandfather     LUNG    Allergies  Allergen Reactions  . Latex Anaphylaxis  . Neomycin  Anaphylaxis  . Ciprofloxacin Other (See Comments)  . Suprax [Cefixime] Other (See Comments)  . Septra [Sulfamethoxazole-Tmp Ds] Rash    Medication list has been reviewed and updated.  Current Outpatient Prescriptions on File Prior to Visit  Medication Sig Dispense Refill  . levETIRAcetam (KEPPRA XR) 500 MG 24 hr tablet Take 1 tablet (500 mg total) by mouth at bedtime.  30 tablet  6  . zonisamide (ZONEGRAN) 100 MG capsule Take 1 cap at bedtime for 1 week, then increase to 2 caps at bedtime for 2 weeks, then 3 caps at bedtime  90 capsule  3  . doxycycline (VIBRAMYCIN) 100 MG capsule Take 100 mg by mouth 2 (two) times daily.       No current facility-administered medications on file prior to visit.    Review of Systems:  As per HPI, otherwise negative.    Physical Examination: Filed Vitals:   05/17/14 1154  BP: 100/60  Pulse: 108  Temp: 98.3 F (36.8 C)  Resp: 16   Filed Vitals:   Cannot calculate BMI with a height equal to zero. Ideal Body Weight:   , GEN: WDWN, obese Non-toxic, A & O x 3 HEENT: Atraumatic, Normocephalic. Neck supple. No masses, No LAD. Ears and Nose: No external deformity. CV: RRR, No  M/G/R. No JVD. No thrill. No extra heart sounds. PULM: CTA B, no wheezes, crackles, rhonchi. No retractions. No resp. distress. No accessory muscle use. ABD: S, NT, ND, +BS. No rebound. No HSM. EXTR: No c/c/e NEURO wheelchair bound.  PSYCH: Normally interactive. Conversant. Not depressed or anxious appearing.  Calm demeanor.    Assessment and Plan: Viral URI Phen c cod mucinex d  Signed,  Phillips OdorJeffery Chaye Misch, MD

## 2014-05-17 NOTE — Patient Instructions (Signed)
Upper Respiratory Infection, Adult An upper respiratory infection (URI) is also sometimes known as the common cold. The upper respiratory tract includes the nose, sinuses, throat, trachea, and bronchi. Bronchi are the airways leading to the lungs. Most people improve within 1 week, but symptoms can last up to 2 weeks. A residual cough may last even longer.  CAUSES Many different viruses can infect the tissues lining the upper respiratory tract. The tissues become irritated and inflamed and often become very moist. Mucus production is also common. A cold is contagious. You can easily spread the virus to others by oral contact. This includes kissing, sharing a glass, coughing, or sneezing. Touching your mouth or nose and then touching a surface, which is then touched by another person, can also spread the virus. SYMPTOMS  Symptoms typically develop 1 to 3 days after you come in contact with a cold virus. Symptoms vary from person to person. They may include:  Runny nose.  Sneezing.  Nasal congestion.  Sinus irritation.  Sore throat.  Loss of voice (laryngitis).  Cough.  Fatigue.  Muscle aches.  Loss of appetite.  Headache.  Low-grade fever. DIAGNOSIS  You might diagnose your own cold based on familiar symptoms, since most people get a cold 2 to 3 times a year. Your caregiver can confirm this based on your exam. Most importantly, your caregiver can check that your symptoms are not due to another disease such as strep throat, sinusitis, pneumonia, asthma, or epiglottitis. Blood tests, throat tests, and X-rays are not necessary to diagnose a common cold, but they may sometimes be helpful in excluding other more serious diseases. Your caregiver will decide if any further tests are required. RISKS AND COMPLICATIONS  You may be at risk for a more severe case of the common cold if you smoke cigarettes, have chronic heart disease (such as heart failure) or lung disease (such as asthma), or if  you have a weakened immune system. The very young and very old are also at risk for more serious infections. Bacterial sinusitis, middle ear infections, and bacterial pneumonia can complicate the common cold. The common cold can worsen asthma and chronic obstructive pulmonary disease (COPD). Sometimes, these complications can require emergency medical care and may be life-threatening. PREVENTION  The best way to protect against getting a cold is to practice good hygiene. Avoid oral or hand contact with people with cold symptoms. Wash your hands often if contact occurs. There is no clear evidence that vitamin C, vitamin E, echinacea, or exercise reduces the chance of developing a cold. However, it is always recommended to get plenty of rest and practice good nutrition. TREATMENT  Treatment is directed at relieving symptoms. There is no cure. Antibiotics are not effective, because the infection is caused by a virus, not by bacteria. Treatment may include:  Increased fluid intake. Sports drinks offer valuable electrolytes, sugars, and fluids.  Breathing heated mist or steam (vaporizer or shower).  Eating chicken soup or other clear broths, and maintaining good nutrition.  Getting plenty of rest.  Using gargles or lozenges for comfort.  Controlling fevers with ibuprofen or acetaminophen as directed by your caregiver.  Increasing usage of your inhaler if you have asthma. Zinc gel and zinc lozenges, taken in the first 24 hours of the common cold, can shorten the duration and lessen the severity of symptoms. Pain medicines may help with fever, muscle aches, and throat pain. A variety of non-prescription medicines are available to treat congestion and runny nose. Your caregiver   can make recommendations and may suggest nasal or lung inhalers for other symptoms.  HOME CARE INSTRUCTIONS   Only take over-the-counter or prescription medicines for pain, discomfort, or fever as directed by your  caregiver.  Use a warm mist humidifier or inhale steam from a shower to increase air moisture. This may keep secretions moist and make it easier to breathe.  Drink enough water and fluids to keep your urine clear or pale yellow.  Rest as needed.  Return to work when your temperature has returned to normal or as your caregiver advises. You may need to stay home longer to avoid infecting others. You can also use a face mask and careful hand washing to prevent spread of the virus. SEEK MEDICAL CARE IF:   After the first few days, you feel you are getting worse rather than better.  You need your caregiver's advice about medicines to control symptoms.  You develop chills, worsening shortness of breath, or brown or red sputum. These may be signs of pneumonia.  You develop yellow or brown nasal discharge or pain in the face, especially when you bend forward. These may be signs of sinusitis.  You develop a fever, swollen neck glands, pain with swallowing, or white areas in the back of your throat. These may be signs of strep throat. SEEK IMMEDIATE MEDICAL CARE IF:   You have a fever.  You develop severe or persistent headache, ear pain, sinus pain, or chest pain.  You develop wheezing, a prolonged cough, cough up blood, or have a change in your usual mucus (if you have chronic lung disease).  You develop sore muscles or a stiff neck. Document Released: 03/17/2001 Document Revised: 12/14/2011 Document Reviewed: 12/27/2013 [employer] Patient Information 2015 [employer]. This information is not intended to replace advice given to you by your health care provider. Make sure you discuss any questions you have with your health care provider.  

## 2014-07-08 ENCOUNTER — Emergency Department (HOSPITAL_COMMUNITY): Payer: BC Managed Care – PPO

## 2014-07-08 ENCOUNTER — Encounter (HOSPITAL_COMMUNITY): Payer: Self-pay | Admitting: Emergency Medicine

## 2014-07-08 ENCOUNTER — Emergency Department (HOSPITAL_COMMUNITY)
Admission: EM | Admit: 2014-07-08 | Discharge: 2014-07-08 | Disposition: A | Discharge status: Home / Self Care | Payer: BC Managed Care – PPO | Attending: Emergency Medicine | Admitting: Emergency Medicine

## 2014-07-08 DIAGNOSIS — J45909 Unspecified asthma, uncomplicated: Secondary | ICD-10-CM | POA: Insufficient documentation

## 2014-07-08 DIAGNOSIS — R109 Unspecified abdominal pain: Secondary | ICD-10-CM | POA: Diagnosis present

## 2014-07-08 DIAGNOSIS — K2100 Gastro-esophageal reflux disease with esophagitis, without bleeding: Secondary | ICD-10-CM

## 2014-07-08 DIAGNOSIS — Z792 Long term (current) use of antibiotics: Secondary | ICD-10-CM | POA: Insufficient documentation

## 2014-07-08 DIAGNOSIS — Z8659 Personal history of other mental and behavioral disorders: Secondary | ICD-10-CM | POA: Insufficient documentation

## 2014-07-08 DIAGNOSIS — R197 Diarrhea, unspecified: Secondary | ICD-10-CM

## 2014-07-08 DIAGNOSIS — Z8776 Personal history of (corrected) congenital malformations of integument, limbs and musculoskeletal system: Secondary | ICD-10-CM | POA: Diagnosis not present

## 2014-07-08 DIAGNOSIS — Z91040 Latex allergy status: Secondary | ICD-10-CM | POA: Diagnosis not present

## 2014-07-08 DIAGNOSIS — R000 Tachycardia, unspecified: Secondary | ICD-10-CM | POA: Diagnosis not present

## 2014-07-08 DIAGNOSIS — E860 Dehydration: Secondary | ICD-10-CM

## 2014-07-08 DIAGNOSIS — Z87728 Personal history of other specified (corrected) congenital malformations of nervous system and sense organs: Secondary | ICD-10-CM | POA: Diagnosis not present

## 2014-07-08 LAB — URINALYSIS, ROUTINE W REFLEX MICROSCOPIC
Glucose, UA: NEGATIVE mg/dL
Ketones, ur: >80 mg/dL — AB
LEUKOCYTES UA: NEGATIVE
NITRITE: NEGATIVE
PH: 5.5 (ref 5.0–8.0)
Protein, ur: NEGATIVE mg/dL
Specific Gravity, Urine: 1.02 (ref 1.005–1.030)
Urobilinogen, UA: 0.2 mg/dL (ref 0.0–1.0)

## 2014-07-08 LAB — URINE MICROSCOPIC-ADD ON

## 2014-07-08 LAB — CBC WITH DIFFERENTIAL/PLATELET
Basophils Absolute: 0 10*3/uL (ref 0.0–0.1)
Basophils Relative: 0 % (ref 0–1)
EOS ABS: 0 10*3/uL (ref 0.0–0.7)
EOS PCT: 1 % (ref 0–5)
HEMATOCRIT: 39.7 % (ref 36.0–46.0)
HEMOGLOBIN: 13.8 g/dL (ref 12.0–15.0)
LYMPHS ABS: 1.4 10*3/uL (ref 0.7–4.0)
LYMPHS PCT: 24 % (ref 12–46)
MCH: 28.5 pg (ref 26.0–34.0)
MCHC: 34.8 g/dL (ref 30.0–36.0)
MCV: 82 fL (ref 78.0–100.0)
MONO ABS: 0.9 10*3/uL (ref 0.1–1.0)
MONOS PCT: 16 % — AB (ref 3–12)
Neutro Abs: 3.5 10*3/uL (ref 1.7–7.7)
Neutrophils Relative %: 59 % (ref 43–77)
PLATELETS: 285 10*3/uL (ref 150–400)
RBC: 4.84 MIL/uL (ref 3.87–5.11)
RDW: 12.7 % (ref 11.5–15.5)
WBC: 5.9 10*3/uL (ref 4.0–10.5)

## 2014-07-08 LAB — COMPREHENSIVE METABOLIC PANEL
ALT: 74 U/L — AB (ref 0–35)
ANION GAP: 17 — AB (ref 5–15)
AST: 33 U/L (ref 0–37)
Albumin: 3.6 g/dL (ref 3.5–5.2)
Alkaline Phosphatase: 197 U/L — ABNORMAL HIGH (ref 39–117)
BUN: 5 mg/dL — AB (ref 6–23)
CALCIUM: 9 mg/dL (ref 8.4–10.5)
CO2: 21 meq/L (ref 19–32)
CREATININE: 0.48 mg/dL — AB (ref 0.50–1.10)
Chloride: 102 mEq/L (ref 96–112)
GLUCOSE: 110 mg/dL — AB (ref 70–99)
Potassium: 3.3 mEq/L — ABNORMAL LOW (ref 3.7–5.3)
Sodium: 140 mEq/L (ref 137–147)
TOTAL PROTEIN: 7.4 g/dL (ref 6.0–8.3)
Total Bilirubin: 0.3 mg/dL (ref 0.3–1.2)

## 2014-07-08 LAB — I-STAT CG4 LACTIC ACID, ED: Lactic Acid, Venous: 1.21 mmol/L (ref 0.5–2.2)

## 2014-07-08 LAB — LIPASE, BLOOD: Lipase: 33 U/L (ref 11–59)

## 2014-07-08 MED ORDER — FAMOTIDINE IN NACL 20-0.9 MG/50ML-% IV SOLN
20.0000 mg | Freq: Once | INTRAVENOUS | Status: AC
  Administered 2014-07-08: 20 mg via INTRAVENOUS
  Filled 2014-07-08: qty 50

## 2014-07-08 MED ORDER — SODIUM CHLORIDE 0.9 % IV BOLUS (SEPSIS)
2000.0000 mL | Freq: Once | INTRAVENOUS | Status: AC
  Administered 2014-07-08: 2000 mL via INTRAVENOUS

## 2014-07-08 MED ORDER — RANITIDINE HCL 150 MG PO TABS: 150.0000 mg | ORAL_TABLET | Freq: Two times a day (BID) | ORAL | Status: DC

## 2014-07-08 MED ORDER — GI COCKTAIL ~~LOC~~
30.0000 mL | Freq: Once | ORAL | Status: AC
  Administered 2014-07-08: 30 mL via ORAL
  Filled 2014-07-08: qty 30

## 2014-07-08 NOTE — ED Provider Notes (Signed)
CSN: [id number]     Arrival date & time 07/08/14  1057 History   First MD Initiated Contact with Patient 07/08/14 1118     Chief Complaint  Patient presents with  . Diarrhea  . Abdominal Pain  . Gastrophageal Reflux     (Consider location/radiation/quality/duration/timing/severity/associated sxs/prior Treatment) Patient is a 33 y.o. female presenting with diarrhea, abdominal pain, and GERD. The history is provided by the patient.  Diarrhea Quality:  Watery Severity:  Severe Onset quality:  Sudden Number of episodes:  >10-15 episodes a day Duration:  4 days Timing:  Constant Progression:  Unchanged Relieved by:  Nothing Exacerbated by: eating or drinking. Ineffective treatments:  Anti-motility medications Associated symptoms: abdominal pain and cough   Associated symptoms: no fever and no vomiting   Risk factors: no recent antibiotic use, no sick contacts, no suspicious food intake and no travel to endemic areas   Abdominal Pain Associated symptoms: diarrhea   Associated symptoms: no fever and no vomiting   Gastrophageal Reflux Associated symptoms include abdominal pain.    Past Medical History  Diagnosis Date  . Asthma   . Anxiety   . Hydrocephalus, congenital   . Spina bifida    Past Surgical History  Procedure Laterality Date  . Appendectomy    . Eye surgery    . Abdominal hysterectomy    . Spine surgery    . Tonsillectomy and adenoidectomy    . 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 Hyperlipidemia Mother   . Diabetes Father   . Heart disease Father   . Hyperlipidemia Father   . Cancer Maternal Grandmother     BREAST AND UTERINE  . Cancer Maternal Grandfather     LUNG  . Cancer Paternal Grandmother     LUNG  . Cancer Paternal Grandfather     LUNG   History  Substance Use Topics  . Smoking status: Never Smoker   . Smokeless tobacco: Not on file  . Alcohol  Use: 0.0 oz/week    4-5 drink(s) per week   OB History   Grav Para Term Preterm Abortions TAB SAB Ect Mult Living                 Review of Systems  Constitutional: Negative for fever.  Gastrointestinal: Positive for abdominal pain and diarrhea. Negative for vomiting.  All other systems reviewed and are negative.     Allergies  Latex; Neomycin; Ciprofloxacin; Suprax; and Septra  Home Medications   Prior to Admission medications   Medication Sig Start Date End Date Taking? Authorizing Provider  doxycycline (VIBRAMYCIN) 100 MG capsule Take 100 mg by mouth 2 (two) times daily.    Historical Provider, MD  levETIRAcetam (KEPPRA XR) 500 MG 24 hr tablet Take 1 tablet (500 mg total) by mouth at bedtime. 03/08/14   Van Clines, MD  promethazine (PHENERGAN) 25 MG tablet  07/06/14   Historical Provider, MD  promethazine-codeine (PHENERGAN WITH CODEINE) 6.25-10 MG/5ML syrup Take 5-10 mLs by mouth every 6 (six) hours as needed. 05/17/14   Carmelina Dane, MD  pseudoephedrine-guaifenesin [facility] D) 60-600 MG per tablet Take 1 tablet by mouth every 12 (twelve) hours. 05/17/14 05/17/15  Carmelina Dane, MD  zonisamide (ZONEGRAN) 100 MG capsule Take 1 cap at bedtime for 1 week, then increase to 2 caps at bedtime for 2 weeks, then 3 caps at bedtime 05/01/14   Clydie Braun  Margarita RanaM Aquino, MD   BP 137/88  Pulse 135  Temp(Src) 98.3 F (36.8 C) (Oral)  Resp 20  SpO2 99% Physical Exam  Nursing note and vitals reviewed. Constitutional: She is oriented to person, place, and time. She appears well-developed and well-nourished. No distress.  HENT:  Head: Normocephalic and atraumatic.  Mouth/Throat: Oropharynx is clear and moist. Mucous membranes are dry.  Eyes: Conjunctivae and EOM are normal. Pupils are equal, round, and reactive to light.  Neck: Normal range of motion. Neck supple.  Cardiovascular: Regular rhythm and intact distal pulses.  Tachycardia present.   No murmur heard. Pulmonary/Chest: Effort  normal and breath sounds normal. No respiratory distress. She has no wheezes. She has no rales.  Abdominal: Soft. She exhibits no distension. There is tenderness in the epigastric area. There is no rebound and no guarding.  Musculoskeletal: Normal range of motion. She exhibits no edema and no tenderness.  Legs are shortened bilaterally with signs of spina bifida  Neurological: She is alert and oriented to person, place, and time.  Skin: Skin is warm and dry. No rash noted. No erythema.  Psychiatric: She has a normal mood and affect. Her behavior is normal.    ED Course  Procedures (including critical care time) Labs Review Labs Reviewed  CBC WITH DIFFERENTIAL - Abnormal; Notable for the following:    Monocytes Relative 16 (*)    All other components within normal limits  COMPREHENSIVE METABOLIC PANEL - Abnormal; Notable for the following:    Potassium 3.3 (*)    Glucose, Bld 110 (*)    BUN 5 (*)    Creatinine, Ser 0.48 (*)    ALT 74 (*)    Alkaline Phosphatase 197 (*)    Anion gap 17 (*)    All other components within normal limits  URINALYSIS, ROUTINE W REFLEX MICROSCOPIC - Abnormal; Notable for the following:    Hgb urine dipstick MODERATE (*)    Bilirubin Urine SMALL (*)    Ketones, ur >80 (*)    All other components within normal limits  URINE MICROSCOPIC-ADD ON - Abnormal; Notable for the following:    Squamous Epithelial / LPF FEW (*)    All other components within normal limits  LIPASE, BLOOD  I-STAT CG4 LACTIC ACID, ED    Imaging Review Dg Chest 2 View  07/08/2014   CLINICAL DATA:  Indigestion and diarrhea for 4 days, history of asthma  EXAM: CHEST  2 VIEW  COMPARISON:  03/08/2014  FINDINGS: Heart size and vascular pattern are normal. Lungs are clear. No effusion. Limited inspiratory effect. Visualized portion of ventriculoperitoneal shunt stable.  IMPRESSION: No active cardiopulmonary disease.   Electronically Signed   By: Esperanza Heiraymond  Rubner M.D.   On: 07/08/2014 12:48      EKG Interpretation None      MDM   Final diagnoses:  Diarrhea  Dehydration  Gastroesophageal reflux disease with esophagitis    Patient with a history of spina bifida who is wheelchair bound and currently on seizure medication presents today with a four-day history of severe diarrhea greater than been 10 episodes per day that is watery and nonbloody as well as abdominal pain that started 2 days ago that's in the epigastric area it feels burning and radiates up into her chest. She denies any vomiting but eating makes the pain worse and she has had very little by mouth intake 2 to severe diarrhea with eating or drinking anything. She's been taking Imodium regularly since the diarrhea started without  improvement.  On exam she is tachycardic and appears dehydrated but has no peritoneal signs and no lower abdominal tenderness concerning for diverticulitis or appendicitis. She has mild epigastric tenderness but with suspicion for cholecystitis or pancreatitis. She denies any antibiotic use in the last year and has no recent contact with nursing home patients immunocompromised patients or patients in the hospital so feels he did have very unlikely. Also no travel.  She is continued to take her seizure medication and denies any seizures. Will hydrate patient with IV fluids. GI cocktail and Pepcid given IV. CBC, CMP, lipase, UA, lactic acid pending  4:03 PM Pt's labs unremarkable except for ketones in the urine.  After 2L of IVF, pepcid and Gi cocktail pt is feeling much better.  HR much improved to the low 100's.  No diarrhea here and pt has tolerated po's.  Will have continue imodium as needed and f/u with PCP this week if does not resolve.  Gwyneth Sprout, MD 07/08/14 [phone]

## 2014-07-08 NOTE — ED Notes (Signed)
Pt. Stated, I started having diarrhea on Wednesday, called Dr. Michaelle CopasSmith's office on 'thursday or  Friday and they told her if not better to come here. Having Abd. Pain and indigestion

## 2014-07-08 NOTE — Discharge Instructions (Signed)
Dehydration, Adult Dehydration is when you lose more fluids from the body than you take in. Vital organs like the kidneys, brain, and heart cannot function without a proper amount of fluids and salt. Any loss of fluids from the body can cause dehydration.  CAUSES   Vomiting.  Diarrhea.  Excessive sweating.  Excessive urine output.  Fever. SYMPTOMS  Mild dehydration  Thirst.  Dry lips.  Slightly dry mouth. Moderate dehydration  Very dry mouth.  Sunken eyes.  Skin does not bounce back quickly when lightly pinched and released.  Dark urine and decreased urine production.  Decreased tear production.  Headache. Severe dehydration  Very dry mouth.  Extreme thirst.  Rapid, weak pulse (more than 100 beats per minute at rest).  Cold hands and feet.  Not able to sweat in spite of heat and temperature.  Rapid breathing.  Blue lips.  Confusion and lethargy.  Difficulty being awakened.  Minimal urine production.  No tears. DIAGNOSIS  Your caregiver will diagnose dehydration based on your symptoms and your exam. Blood and urine tests will help confirm the diagnosis. The diagnostic evaluation should also identify the cause of dehydration. TREATMENT  Treatment of mild or moderate dehydration can often be done at home by increasing the amount of fluids that you drink. It is best to drink small amounts of fluid more often. Drinking too much at one time can make vomiting worse. Refer to the home care instructions below. Severe dehydration needs to be treated at the hospital where you will probably be given intravenous (IV) fluids that contain water and electrolytes. HOME CARE INSTRUCTIONS   Ask your caregiver about specific rehydration instructions.  Drink enough fluids to keep your urine clear or pale yellow.  Drink small amounts frequently if you have nausea and vomiting.  Eat as you normally do.  Avoid:  Foods or drinks high in sugar.  Carbonated  drinks.  Juice.  Extremely hot or cold fluids.  Drinks with caffeine.  Fatty, greasy foods.  Alcohol.  Tobacco.  Overeating.  Gelatin desserts.  Wash your hands well to avoid spreading bacteria and viruses.  Only take over-the-counter or prescription medicines for pain, discomfort, or fever as directed by your caregiver.  Ask your caregiver if you should continue all prescribed and over-the-counter medicines.  Keep all follow-up appointments with your caregiver. SEEK MEDICAL CARE IF:  You have abdominal pain and it increases or stays in one area (localizes).  You have a rash, stiff neck, or severe headache.  You are irritable, sleepy, or difficult to awaken.  You are weak, dizzy, or extremely thirsty. SEEK IMMEDIATE MEDICAL CARE IF:   You are unable to keep fluids down or you get worse despite treatment.  You have frequent episodes of vomiting or diarrhea.  You have blood or green matter (bile) in your vomit.  You have blood in your stool or your stool looks black and tarry.  You have not urinated in 6 to 8 hours, or you have only urinated a small amount of very dark urine.  You have a fever.  You faint. MAKE SURE YOU:   Understand these instructions.  Will watch your condition.  Will get help right away if you are not doing well or get worse. Document Released: 09/21/2005 Document Revised: 12/14/2011 Document Reviewed: 05/11/2011 [employer] Patient Information 2015 [employer]. This information is not intended to replace advice given to you by your health care provider. Make sure you discuss any questions you have with your health care   provider.  

## 2014-07-08 NOTE — ED Notes (Signed)
Pt given crackers and gingerale.

## 2014-07-11 ENCOUNTER — Ambulatory Visit: Payer: BC Managed Care – PPO | Admitting: Neurology

## 2014-07-11 ENCOUNTER — Telehealth: Payer: Self-pay | Admitting: *Deleted

## 2014-07-11 NOTE — Telephone Encounter (Signed)
Noted  

## 2014-07-11 NOTE — Telephone Encounter (Signed)
Patient cancelled follow up appointment for today 07/11/2014

## 2014-10-30 ENCOUNTER — Other Ambulatory Visit: Payer: Self-pay | Admitting: Neurology

## 2014-12-13 ENCOUNTER — Ambulatory Visit
Payer: BLUE CROSS/BLUE SHIELD | Attending: Family Medicine | Admitting: Rehabilitative and Restorative Service Providers"

## 2014-12-13 DIAGNOSIS — G801 Spastic diplegic cerebral palsy: Secondary | ICD-10-CM | POA: Insufficient documentation

## 2014-12-13 DIAGNOSIS — G808 Other cerebral palsy: Secondary | ICD-10-CM

## 2014-12-13 DIAGNOSIS — Q059 Spina bifida, unspecified: Secondary | ICD-10-CM | POA: Diagnosis not present

## 2014-12-13 NOTE — Therapy (Signed)
[facility] [address] [place], [place], [address] Phone: [phone]   Fax:  [phone]  Physical Therapy Evaluation  Patient Details  Name: Carol Zavala Date of Birth: [birth year] Referring Provider:  Carol Ada, MD  Encounter Date: 12/13/2014    Past Medical History  Diagnosis Date  . Asthma   . Anxiety   . Hydrocephalus, congenital   . Spina bifida     Past Surgical History  Procedure Laterality Date  . Appendectomy    . Eye surgery    . Abdominal hysterectomy    . Spine surgery    . Tonsillectomy and adenoidectomy    . 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  Visit Diagnosis:  Spina bifida  Congenital paraplegia    Mobility/Seating Evaluation      PATIENT INFORMATION: Name: Carol Zavala, Carol Zavala DOB: 08-22-1981  Sex: female Date seen: 12/13/2014 Time: 850  Address:  [address] [place] Physician: Carol Ada, MD This evaluation/justification form will serve as the LMN for the following suppliers: __________________________ Supplier: [employer]  Contact Person: Almeta Monas Phone:  [phone]   Seating Therapist: Rudell Cobb, PT Phone:   [phone]   Phone: [phone]    Spouse/Parent/Caregiver name: ?????  Phone number: ????? Insurance/Payer: BCBS/medicaid secondary     Reason for Referral: Obtain power wheelchair  Patient/Caregiver Goals: Energy conservation, maintain shoulder integrity due to long term use of manual wheelchair, improve access to work environment   MEDICAL HISTORY: Diagnosis: Primary Diagnosis: spina bifida, kyphosis of spine, hydrocephalus with shunt  Onset: congenital Diagnosis: seizures   [] Progressive Disease Relevant past and future surgeries: shunt placement, h/o multiple back surgeries, appendectomy, hysterectomy,    Height: 4'9" Weight:  168 lbs Explain recent changes or trends in weight: ?????   History including Falls: Patient requires use of wheelchair for all mobility to access home and/or community environments.  The patient has had a manual wheelchair x years (except for power in college to access campus).  Patient is c/o sharp pain in left shoulder when propelling manual wheelchair.  She is seeking power mobility due to change in status.     HOME ENVIRONMENT: [] House  [] Condo/town home  [x] Apartment  [] Assisted Living    [] Lives Alone [x]  Lives with Others                                                                                          Hours with caregiver: Has roommate, but patient is independent with all ADLs/IADLs from wheelchair level.  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  Other: ?????    FUNCTIONAL/SENSORY PROCESSING SKILLS:  Handedness:   [] Right     [x] Left    [] NA  Comments:  ?????  Functional Processing Skills for Wheeled Mobility [x] Processing Skills are adequate for safe wheelchair operation  Areas of concern than may interfere with safe operation of wheelchair Description of problem   []  Attention to environment      [] Judgment      []  Hearing  []  Vision or visual processing      [] Motor Planning  []  Fluctuations in Behavior  ?????    VERBAL COMMUNICATION: [x] WFL receptive [x]  WFL expressive [] Understandable  [] Difficult to understand  [] non-communicative []  Uses an augmented communication device    SENSATION and SKIN ISSUES: Sensation [] Intact  [] Impaired [x] Absent  Level of sensation: loss of sensation distal to iliac crest  Pressure Relief: Able to perform effective pressure relief :    [x] Yes  []  No Method: press ups with bilateral UEs. If not, Why?: ?????  Skin Issues/Skin Integrity Current Skin Issues  [] Yes [x] No [] Intact []  Red area[]  Open Area  [] Scar Tissue [] At risk from prolonged sitting Where  ?????  History of Skin Issues  [x] Yes [] No Where  sacrum When  years ago  Hx of skin flap surgeries  [x] Yes [] No Where  sacrum When  adolescent years  Limited sitting tolerance [] Yes [x] No Hours spent sitting in wheelchair daily: 16+ hours  Complaint of Pain:  Please describe: L shoulder 5/10 pain with w/c propulsion  Low back pain up to 9/10 with h/o muscle spasms   Swelling/Edema: distal LEs swell as day goes on   ADL STATUS (in reference to wheelchair use):  Indep Assist Unable Indep with Equip Not assessed Comments  Dressing x ???? ????? ????? ????? ?????  Eating x ???? ????? ????? ????? ?????  Toileting x ???? ????? ????? ????? ?????  Bathing x ???? ????? ????? ????? ?????  Grooming/Hygiene x ???? ????? ????? ????? ?????  Meal Prep x ???? ????? ????? ????? ?????  IADLS x ???? ????? ????? ????? ?????  Bowel Management: [] Continent  [] Incontinent  [x] Accidents Comments:  bowel program  Bladder Management: [] Continent  [] Incontinent  [x] Accidents Comments:  in/out cath every 4 hours    CURRENT SEATING / MOBILITY:  Current Mobility Base:  [] None [] Dependent [x] Manual [] Scooter [] Power  Type of Control: ?????  Manufacturer:  invacare prospin x4 with J 3 back, invacare matrix flowvair cushionSize:  ?????Age: approximately 6 years  Current Condition of Mobility Base:  worn casters, worn tires, usual wear and tear of chair   Current Wheelchair components:  manual with swing away leg rests  Describe posture in present seating system:  leg rests too long (not providing adequate LE support), patient leaning into back canes    WHEELCHAIR SKILLS: Manual w/c Propulsion: [x] UE or LE strength and endurance  sufficient to participate in ADLs using manual wheelchair Arm : [] left [] right   [] Both      Distance: limited due to L shoulder pain + low back pain Foot:  [] left [] right   [] Both  Operate Scooter: []  Strength, hand grip, balance and transfer appropriate for use [] Living environment is accessible for use of scooter  Operate Power w/c:  [x]  Std. Joystick   []  Alternative Controls Indep [x]  Assist []  Dependent/unable []  N/A []   [x] Safe          []  Functional      Distance: ?????  Bed confined without wheelchair [x]  Yes []   No  STRENGTH/RANGE OF MOTION:  ????? Range of Motion Strength  Shoulder WFLs bilateral shoulder flexion and abduction + ER/IR L side is 4/5 shoulder flexion and abduction R side is 3+/5 shoulder flexion and abduction   Elbow WFLs flexion/extension/supination L side is 5/5 elbow flexion/extension R side is 4/5 elbow flexion, 5/5 elbow extension  Wrist/Hand WFLs flexion/extension L side is 5/5 wrist flexion/extension R side is 4/5 wrist flexion/extension   Hip full ROM for positioning Absent 0/5 strength  Knee full ROM for positioning Absent 0/5 strength  Ankle Minimal ankle ROM with foot deformity Absent 0/5 strength    MOBILITY/BALANCE:  []  Patient is totally dependent for mobility  ?????    Balance Transfers Ambulation  Sitting Balance: Standing Balance: [x]  Independent []  Independent/Modified Independent  []  WFL     []  WFL []  Supervision []  Supervision  [x]  Uses UE for balance  []  Supervision []  Min Assist []  Ambulates with Assist  ?????    []  Min Assist []  Min assist []  Mod Assist []  Ambulates with Device:      []  RW  []  StW  []  Cane  []  ?????  []  Mod Assist []  Mod assist []  Max assist   []  Max Assist []  Max assist []  Dependent []  Indep. Short Distance Only  []  Unable [x]  Unable []  Lift / Sling Required Distance (in feet)  ?????   []  Sliding board [x]  Unable to Ambulate (see explanation below)  Cardio Status:  [x] Intact  []  Impaired   []  NA     ?????   Respiratory Status:  [x] Intact   [] Impaired   [] NA     ?????  Orthotics/Prosthetics: ?????  Comments (Address manual vs power w/c vs scooter): Pt uses boost transfer w/c<>mat, she can sit unsupported without UE for static balance, she uses UE support for dynamic balance due to weakness noted in trunk musculature.     MAT EVALUATION:        Additional Comments: See vendor notes    POSTURE:  Describe Reflexes/tonal influence on body: ?????   COMMENTS:   Anterior / Posterior Obliquity Rotation-Pelvis ?????        PELVIS                [x]  []  []   Neutral Posterior Anterior  [x]  []  []   WFL Rt elev Lt elev  [x]  []  []   WFL Right Left                      Anterior    Anterior      []  Fixed []  Other []  Partly Flexible []  Flexible   []  Fixed []  Other []  Partly Flexible  []  Flexible  []  Fixed []  Other []  Partly Flexible  []  Flexible     TRUNK Anterior / Posterior Left Right Rotation-shoulders and upper trunk    ?????    [x]  []  []   WFL ? Thoracic ? Lumbar  Kyphosis Lordosis  [x]  []  []   WFL Convex Convex  Right Left [] c-curve [] s-curve [] multiple  [x]  Neutral []  Left-anterior []  Right-anterior     []  Fixed []  Flexible []  Partly Flexible []  Other  []  Fixed []  Flexible []  Partly Flexible []  Other  []  Fixed             []  Flexible []  Partly Flexible []  Other    Position Windswept  ?????        HIPS                      [  x]           []               []    Neutral       Abduct        ADduct         [x]           []            []   Neutral Right           Left      []  Fixed []  Subluxed []  Partly Flexible []  Dislocated []  Flexible  []  Fixed []  Other []  Partly Flexible  []  Flexible                 Foot Positioning Knee Positioning  ?????    [x]  WFL  [] Lt [] Rt [x]  WFL  [] Lt [] Rt    KNEES ROM concerns: ROM concerns:    & Dorsi-Flexed [] Lt [] Rt ?????    FEET Plantar Flexed [] Lt [] Rt      Inversion                 [] Lt [] Rt      Eversion                 [] Lt  [] Rt     HEAD [x]  Functional [x]  Good Head Control  ?????  & []  Flexed         []  Extended []  Adequate Head Control    NECK []  Rotated  Lt  []  Lat Flexed Lt []  Rotated  Rt []  Lat Flexed Rt []  Limited Head Control     []  Cervical Hyperextension []  Absent  Head Control     SHOULDERS ELBOWS WRIST& HAND L shoulder pain, L dominant, R shoulder weakness      Left     Right    Left     Right    Left     Right   U/E [x] Functional           [x] Functional functional functional [] Fisting             [] Fisting      [] elev   [] dep      [] elev   [] dep       [] pro -[] retract     [] pro  [] retract [] subluxed             [] subluxed         Goals for Wheelchair Mobility  [x]  Independence with mobility in the home with motor related ADLs (MRADLs)  [x]  Independence with MRADLs in the community []  Provide dependent mobility  []  Provide recline     [x] Provide tilt   Goals for Seating system [x]  Optimize pressure distribution [x]  Provide support needed to facilitate function or safety []  Provide corrective forces to assist with maintaining or improving posture []  Accommodate client's posture:   current seated postures and positions are not flexible or will not tolerate corrective forces [x]  Client to be independent with relieving pressure in the wheelchair [] Enhance physiological function such as breathing, swallowing, digestion  Simulation ideas/Equipment trials:????? State why other equipment was unsuccessful:?????   MOBILITY BASE RECOMMENDATIONS and JUSTIFICATION: MOBILITY COMPONENT JUSTIFICATION  Manufacturer: PrideModel: Select 6   Size: Width 18Seat Depth 18 [x] provide transport from point A to B      [x] promote Indep mobility  [x] is not a safe, functional ambulator [x] walker or cane inadequate [] non-standard width/depth necessary to accommodate anatomical measurement []  ?????  [] Manual Mobility  Base [] non-functional ambulator    [] Scooter/POV  [] can safely operate  [] can safely transfer    [] has adequate trunk stability  [] cannot functionally propel manual w/c  [x] Power Mobility Base  [x] non-ambulatory  [x] cannot functionally propel manual wheelchair for needed distances []  cannot functionally and safely operate scooter/POV [x] can safely operate and willing to  [] Stroller Base [] infant/child  [] unable to propel manual wheelchair [] allows for growth [] non-functional ambulator [] non-functional UE [] Indep mobility is not a goal at this time  [x] Tilt  [] Forward [x] Backward [x] Powered tilt  [] Manual tilt  [] change position against gravitational force on head and shoulders  [] change position for pressure relief/cannot weight shift [] transfers  [] management of tone [] rest periods [] control edema [] facilitate postural control  []  ?????  [] Recline  [] Power recline on power base [] Manual recline on manual base  [] accommodate femur to back angle  [] bring to full recline for ADL care  [] change position for pressure relief/cannot weight shift [] rest periods [] repositioning for transfers or clothing/diaper /catheter changes [] head positioning  [] Lighter weight required [] self- propulsion  [] lifting []  ?????  [] Heavy Duty required [] user weight greater than 250# [] extreme tone/ over active movement [] broken frame on previous chair []  ?????  [x]  Back  [x]  Angle Adjustable []  Custom molded 20" Jay 3 back [x] postural control [] control of tone/spasticity [] accommodation of range of motion [x] UE functional control [x] accommodation for seating system []  ????? [] provide lateral trunk support [] accommodate deformity [x] provide posterior trunk support [] provide lumbar/sacral support [x] support trunk in midline [] Pressure relief over spinal processes  [x]  Seat Cushion Invacare matrix flovair [x] impaired sensation  [] decubitus ulcers present [x] history of pressure ulceration [] prevent pelvic extension [] low maintenance  [x] stabilize pelvis  [] accommodate  obliquity [] accommodate multiple deformity [x] neutralize lower extremity position [x] increase pressure distribution []  ?????  []  Pelvic/thigh support  []  Lateral thigh guide []  Distal medial pad  []  Distal lateral pad []  pelvis in neutral [] accommodate pelvis []  position upper legs []  alignment []  accommodate ROM []  decr adduction [] accommodate tone [] removable for transfers [] decr abduction  []  Lateral trunk Supports []  Lt     []  Rt [] decrease lateral trunk leaning [] control tone [] contour for increased contact [] safety  [] accommodate asymmetry []  ?????  [x]  Mounting hardware  [] lateral trunk supports  [x] back   [] seat [x] headrest      []  thigh support [] fixed   [] swing away [x] attach seat platform/cushion to w/c frame [x] attach back cushion to w/c frame [] mount postural supports [x] mount headrest  [] swing medial thigh support away [] swing lateral supports away for transfers  [x]  swing away joystick    Armrests  [] fixed [x] adjustable height [] removable   [] swing away  [x] flip back   [] reclining [x] full length pads [] desk    [] pads tubular  [x] provide support with elbow at 90   [] provide support for w/c tray [] change of height/angles for variable activities [x] remove for transfers [x] allow to come closer to table top [] remove for access to tables []  ?????  Hangers/ Leg rests  [] 60 [x] 70 [] 90 [] elevating [] heavy duty  [] articulating [] fixed [] lift off [x] swing away     [] power [x] provide LE support  [] accommodate to hamstring tightness [] elevate legs during recline   [] provide change in position for Legs [x] Maintain placement of feet on footplate [x] durability [x] enable transfers [x] decrease edema [x] Accommodate lower leg length []  ?????  Foot support Footplate    [] Lt  []  Rt  []  Center mount [x] flip up     [x] depth/angle adjustable [] Amputee adapter    []  Lt     []  Rt [x] provide foot support [x] accommodate to ankle ROM [] transfers []   Provide support for residual  extremity []  allow foot to go under wheelchair base []  decrease tone  []  ?????  []  Ankle strap/heel loops [] support foot on foot support [] decrease extraneous movement [] provide input to heel  [] protect foot  Tires: [x] pneumatic  [x] flat free inserts  [] solid  [x] decrease maintenance  [] prevent frequent flats [] increase shock absorbency [] decrease pain from road shock [] decrease spasms from road shock []  ?????  [x]  Headrest  [x] provide posterior head support [x] provide posterior neck support [] provide lateral head support [] provide anterior head support [x] support during tilt and recline [] improve feeding   [] improve respiration [] placement of switches [x] safety  [] accommodate ROM  [] accommodate tone [] improve visual orientation  []  Anterior chest strap []  Vest []  Shoulder retractors  [] decrease forward movement of shoulder [] accommodation of TLSO [] decrease forward movement of trunk [] decrease shoulder elevation [] added abdominal support [] alignment [] assistance with shoulder control  []  ?????  Pelvic Positioner [x] Belt [] SubASIS bar [] Dual Pull [] stabilize tone [x] decrease falling out of chair/ **will not Decr potential for sliding due to pelvic tilting [] prevent excessive rotation [] pad for protection over boney prominence [] prominence comfort [] special pull angle to control rotation []  ?????  Upper Extremity Support [] L   []  R [] Arm trough    [] hand support []  tray       [] full tray [] swivel mount [] decrease edema      [] decrease subluxation   [] control tone   [] placement for AAC/Computer/EADL [] decrease gravitational pull on shoulders [] provide midline positioning [] provide support to increase UE function [] provide hand support in natural position [] provide work surface   POWER WHEELCHAIR CONTROLS  [x] Proportional  [] Non-Proportional Type swing away [x] Left  [] Right [x] provides access for controlling wheelchair   [] lacks motor control to operate  proportional drive control [] unable to understand proportional controls  Actuator Control Module  [x] Single  [] Multiple   [x] Allow the client to operate the power seat function(s) through the joystick control   [] Safety Reset Switches [] Used to change modes and stop the wheelchair when driving in latch mode    [] Upgraded Electronics   [] programming for accurate control [] progressive Disease/changing condition [] non-proportional drive control needed [] Needed in order to operate power seat functions through joystick control   [] Display box [] Allows user to see in which mode and drive the wheelchair is set  [] necessary for alternate controls    [] Digital interface electronics [] Allows w/c to operate when using alternative drive controls  [] ASL Head Array [] Allows client to operate wheelchair  through switches placed in tri-panel headrest  [] Sip and puff with tubing kit [] needed to operate sip and puff drive controls  [] Upgraded tracking electronics [] increase safety when driving [] correct tracking when on uneven surfaces  [x] Mount for switches or joystick [x] Attaches switches to w/c  [x] Swing away for access or transfers [] midline for optimal placement [] provides for consistent access  [] Attendant controlled joystick plus mount [] safety [] long distance driving [] operation of seat functions [] compliance with transportation regulations []  ?????    Rear wheel placement/Axle adjustability [] None [] semi adjustable [] fully adjustable  [] improved UE access to wheels [] improved stability [] changing angle in space for improvement of postural stability [] 1-arm drive access [] amputee pad placement []  ?????  Wheel rims/ hand rims  [] metal  [] plastic coated [] oblique projections [] vertical projections [] Provide ability to propel manual wheelchair  []  Increase self-propulsion with hand weakness/decreased grasp  Push handles [] extended  [] angle adjustable  [] standard [] caregiver  access [] caregiver assist [] allows "hooking" to enable increased ability to perform ADLs or maintain balance  One armed device  [] Lt   [] Rt [] enable  propulsion of manual wheelchair with one arm   '[]'$  ?????   Brake/wheel lock extension $RemoveBefore'[]'FwJdwZYgXEIVL$  Lt   '[]'$  Rt $R'[]'os$ increase indep in applying wheel locks   '[]'$ Side guards $RemoveBef'[]'JCvkKzfTpn$ prevent clothing getting caught in wheel or becoming soiled $RemoveBeforeD'[]'bIJaVYpoxoAfKu$  prevent skin tears/abrasions  Battery: 2 U1 gel cell batteries $RemoveBefore'[x]'brujHlRzXyuOA$ to power wheelchair provide tilt  Other: ????? ????? ?????  The above equipment has a life- long use expectancy. Growth and changes in medical and/or functional conditions would be the exceptions. This is to certify that the therapist has no financial relationship with durable medical provider or manufacturer. The therapist will not receive remuneration of any kind for the equipment recommended in this evaluation.   Patient has mobility limitation that significantly impairs safe, timely participation in one or more mobility related ADL's.  (bathing, toileting, feeding, dressing, grooming, moving from room to room)                                       '[x]'$  Yes $Re'[]'HFL$  No Will mobility device sufficiently improve ability to participate and/or be aided in participation of MRADL's?          '[x]'$  Yes $Re'[]'Hvh$  No Can limitation be compensated for with use of a cane or walker?                                                            '[]'$  Yes $Re'[x]'KWZ$  No Does patient or caregiver demonstrate ability/potential ability & willingness to safely use the mobility device?    '[x]'$  Yes $Re'[]'glH$  No Does patient's home environment support use of recommended mobility device?                                   '[x]'$  Yes $Re'[]'hYJ$  No Does patient have sufficient upper extremity function necessary to functionally propel a manual wheelchair?     '[x]'$  Yes $Re'[]'Aju$  No (with shoulder pain limiting mobility) Does patient have sufficient strength and trunk stability to safely operate a POV (scooter)?                    '[]'$  Yes $Re'[]'dLy$  No Does  patient need additional features/benefits provided by a power wheelchair for MRADL's in the home?        '[x]'$  Yes $Re'[]'IIY$  No Does the patient demonstrate the ability to safely use a power wheelchair?                                            '[x]'$  Yes $Re'[]'TUQ$  No  Therapist Name Printed: Rudell Cobb, PT Date: 12/13/2014  Therapist's Signature:   Date:   Supplier's Name Printed: [employer] Monas, [place] Date: 12/13/2014  Supplier's Signature:   Date:  Patient/Caregiver Signature:   Date:     This is to certify that I have read this evaluation and do agree with the content within:    Physician's Name Printed: Carol Ada, MD  35 Signature:  Date:     This is to certify that I, the above signed therapist  have the following affiliations: []  This DME provider []  Manufacturer of recommended equipment []  Patient's long term care facility []  None of the above                                        Problem List Patient Active Problem List   Diagnosis Date Noted  . Seizures 02/20/2014  . Hydrocephalus 02/20/2014  . Spina bifida 02/20/2014    [place], PT 12/13/2014, 9:44 AM  [facility] [address] [place], [place], [address] Phone: [phone]   Fax:  [phone]

## 2015-01-03 ENCOUNTER — Other Ambulatory Visit: Payer: Self-pay | Admitting: Family Medicine

## 2015-01-03 ENCOUNTER — Ambulatory Visit (INDEPENDENT_AMBULATORY_CARE_PROVIDER_SITE_OTHER): Payer: BLUE CROSS/BLUE SHIELD

## 2015-01-03 ENCOUNTER — Ambulatory Visit (INDEPENDENT_AMBULATORY_CARE_PROVIDER_SITE_OTHER): Payer: BLUE CROSS/BLUE SHIELD | Admitting: Family Medicine

## 2015-01-03 VITALS — BP 110/84 | HR 119 | Temp 98.0°F

## 2015-01-03 DIAGNOSIS — Q059 Spina bifida, unspecified: Secondary | ICD-10-CM

## 2015-01-03 DIAGNOSIS — R350 Frequency of micturition: Secondary | ICD-10-CM | POA: Diagnosis not present

## 2015-01-03 DIAGNOSIS — R059 Cough, unspecified: Secondary | ICD-10-CM

## 2015-01-03 DIAGNOSIS — J9801 Acute bronchospasm: Secondary | ICD-10-CM

## 2015-01-03 DIAGNOSIS — J988 Other specified respiratory disorders: Secondary | ICD-10-CM

## 2015-01-03 DIAGNOSIS — J22 Unspecified acute lower respiratory infection: Secondary | ICD-10-CM

## 2015-01-03 DIAGNOSIS — R05 Cough: Secondary | ICD-10-CM | POA: Diagnosis not present

## 2015-01-03 LAB — POCT UA - MICROSCOPIC ONLY
Bacteria, U Microscopic: NEGATIVE
Casts, Ur, LPF, POC: NEGATIVE
Crystals, Ur, HPF, POC: NEGATIVE
MUCUS UA: NEGATIVE
RBC, urine, microscopic: NEGATIVE
Yeast, UA: NEGATIVE

## 2015-01-03 LAB — POCT CBC
Granulocyte percent: 66.2 %G (ref 37–80)
HEMATOCRIT: 45.4 % (ref 37.7–47.9)
Hemoglobin: 14.2 g/dL (ref 12.2–16.2)
LYMPH, POC: 2.9 (ref 0.6–3.4)
MCH: 27.5 pg (ref 27–31.2)
MCHC: 31.4 g/dL — AB (ref 31.8–35.4)
MCV: 87.7 fL (ref 80–97)
MID (cbc): 0.4 (ref 0–0.9)
MPV: 7.9 fL (ref 0–99.8)
POC GRANULOCYTE: 6.4 (ref 2–6.9)
POC LYMPH PERCENT: 29.8 %L (ref 10–50)
POC MID %: 4 %M (ref 0–12)
Platelet Count, POC: 351 10*3/uL (ref 142–424)
RBC: 5.18 M/uL (ref 4.04–5.48)
RDW, POC: 14.4 %
WBC: 9.7 10*3/uL (ref 4.6–10.2)

## 2015-01-03 LAB — POCT URINALYSIS DIPSTICK
BILIRUBIN UA: NEGATIVE
GLUCOSE UA: NEGATIVE
KETONES UA: NEGATIVE
Leukocytes, UA: NEGATIVE
NITRITE UA: NEGATIVE
Protein, UA: NEGATIVE
Spec Grav, UA: 1.025
UROBILINOGEN UA: 0.2
pH, UA: 5

## 2015-01-03 MED ORDER — LEVOFLOXACIN 500 MG PO TABS: 500.0000 mg | ORAL_TABLET | Freq: Every day | ORAL | Status: DC

## 2015-01-03 MED ORDER — PREDNISONE 20 MG PO TABS: ORAL_TABLET | ORAL | Status: DC

## 2015-01-03 NOTE — Patient Instructions (Signed)

## 2015-01-03 NOTE — Progress Notes (Addendum)
Subjective:   This chart was scribed for Nilda Simmer, MD by Jarvis Morgan, ED Scribe. This patient was seen in Room 8 and the patient's care was started at 9:05 PM.   Patient ID: Carol Zavala, female    DOB: May 01, 1981, 34 y.o.   MRN: [id number]  01/03/2015  URI and Urinary Tract Infection   HPI  HPI Comments: Carol Zavala is a 34 y.o. female who presents to the [facility] complaining of an ongoing illness for 1 month. Pt states it started as a gradually worsening cough for 2 weeks accompanied with fatigue and congestion. She states she was not able to get out of bed due to the illness for the first week. She also initially had SOB so she went to [facility] and was diagnosed with bronchitis and prescribed doxycycline. She took it for 5 days until she developed itching all over her body and throat that exacerbated her cough. Pt called her PCP and was told she was having an allergic rxn to the medication. She was told to take Zyrtec and Delsym and told to return to the physician she saw at [facility] since he was who prescribed the doxycycline. Pt states that her symptoms have not gotten any better over the course of the month. Pt states that along with the cough she has been having chest congestion, clear and green rhinorrhea, wheezing, HA, and chest and back pain from the excessive coughing. Pt notes that the cough is worse in the morning. Pt has had bronchitis in the past for which she was prescribed an inhaler. She also has a nebulizer at home and has been using it 2x daily during the week and every 4 hours on the weekends. Pt states that the nebulizer has provided relief. Pt admits to h/o mild seasonal allergies but states this feels different. Pt denies h/o asthma. Pt denies any recent travel. She denies any fever, chills, sweats, itchy eyes, nausea, vomiting, or diarrhea.   Pt has a second complaint of a possible UTI. Pt has spina bifida and self caths herself at  home. She states that she has not been voiding much urine and it is concerning her because she has been drinking a lot of fluids due to her illness. She states that her urine is also very dark. She has been drinking 2 bottles of water a day which she states is more than she usually does.  She has a h/o recurrent UTIs.  Review of Systems  Constitutional: Positive for fatigue. Negative for fever and chills.  HENT: Positive for congestion, rhinorrhea, sinus pressure and sneezing. Negative for ear pain and sore throat.   Eyes: Negative for itching.  Respiratory: Positive for cough, shortness of breath and wheezing. Negative for stridor.   Cardiovascular: Positive for chest pain (due to cough).  Gastrointestinal: Negative for nausea, vomiting, abdominal pain and diarrhea.  Genitourinary: Negative for dysuria and hematuria.  Musculoskeletal: Positive for back pain (due to cough).  Neurological: Positive for headaches. Negative for dizziness and light-headedness.    Past Medical History  Diagnosis Date  . Asthma   . Anxiety   . Hydrocephalus, congenital   . Spina bifida    Past Surgical History  Procedure Laterality Date  . Appendectomy    . Eye surgery    . Abdominal hysterectomy    . Spine surgery    . Tonsillectomy and adenoidectomy    . 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 Latex Anaphylaxis  . Neomycin Anaphylaxis  . Azithromycin Diarrhea  . Ciprofloxacin Other (See Comments)  . Doxycycline Itching  . Suprax [Cefixime] Other (See Comments)  . Septra [Sulfamethoxazole-Trimethoprim] Rash   Current Outpatient Prescriptions  Medication Sig Dispense Refill  . cetirizine (ZYRTEC) 10 MG tablet Take 10 mg by mouth daily.    Marland Kitchen dextromethorphan (DELSYM) 30 MG/5ML liquid Take by mouth as needed for cough.    . ranitidine (ZANTAC) 150 MG tablet Take 1 tablet (150 mg total) by mouth 2 (two) times daily. 28 tablet  0  . zonisamide (ZONEGRAN) 100 MG capsule Take 100 mg by mouth See admin instructions. Take 1 cap at bedtime for 1 week, then increase to 2 caps at bedtime for 2 weeks, then 3 caps at bedtime    . zonisamide (ZONEGRAN) 100 MG capsule Take 3 capsules at bedtime 90 capsule 1  . levofloxacin (LEVAQUIN) 500 MG tablet Take 1 tablet (500 mg total) by mouth daily. 10 tablet 0  . predniSONE (DELTASONE) 20 MG tablet Two tablets daily x 5 days then one tablet daily x 5 days 15 tablet 0   No current facility-administered medications for this visit.       Objective:    Triage Vitals: BP 110/84 mmHg  Pulse 119  Temp(Src) 98 F (36.7 C) (Oral)  Ht   Wt   SpO2 100%  Physical Exam  Constitutional: She is oriented to person, place, and time. She appears well-developed and well-nourished. No distress.  In wheelchair; obese.  HENT:  Head: Normocephalic and atraumatic.  Right Ear: Tympanic membrane, external ear and ear canal normal.  Left Ear: Tympanic membrane, external ear and ear canal normal.  Nose: Mucosal edema and rhinorrhea present. Right sinus exhibits no maxillary sinus tenderness and no frontal sinus tenderness. Left sinus exhibits no maxillary sinus tenderness and no frontal sinus tenderness.  Mouth/Throat: Oropharynx is clear and moist.  Eyes: Conjunctivae and EOM are normal.  Neck: Neck supple. No thyromegaly present.  Cardiovascular: Regular rhythm and normal heart sounds.  Tachycardia present.  Exam reveals no gallop and no friction rub.   No murmur heard. HR 119  Pulmonary/Chest: Effort normal and breath sounds normal. No respiratory distress. She has no wheezes. She has no rales.  Musculoskeletal: Normal range of motion.  Lymphadenopathy:    She has no cervical adenopathy.  Neurological: She is alert and oriented to person, place, and time.  Skin: Skin is warm and dry. No rash noted. She is not diaphoretic.  Psychiatric: She has a normal mood and affect. Her behavior is normal.   Nursing note and vitals reviewed.     [facility] reading (PRIMARY) by  Dr. Katrinka Blazing  CXR: NAD      Assessment & Plan:   1. Lower respiratory infection   2. Bronchospasm   3. Cough   4. Frequency of urination   5. Spina bifida      1. Lower respiratory infection: New. Onset one month ago;mulitple drug allergies; rx for Levaquin provided; continue Delsym for cough. 2.  Bronchospasm: New.  Continue Albuterol nebulizer solution bid to qid PRN.  Rx for Prednisone provided. 3.  Urination abnormalities: New. Send urine culture; performs self catheterization at home due to [facility].  History of recurrent UTIs.    Meds ordered this encounter  Medications  . dextromethorphan (DELSYM) 30 MG/5ML liquid    Sig: Take by mouth as needed for cough.  . cetirizine (ZYRTEC)  10 MG tablet    Sig: Take 10 mg by mouth daily.  Marland Kitchen. levofloxacin (LEVAQUIN) 500 MG tablet    Sig: Take 1 tablet (500 mg total) by mouth daily.    Dispense:  10 tablet    Refill:  0  . predniSONE (DELTASONE) 20 MG tablet    Sig: Two tablets daily x 5 days then one tablet daily x 5 days    Dispense:  15 tablet    Refill:  0    No Follow-up on file.  I personally performed the services described in this documentation, which was scribed in my presence. The recorded information has been reviewed and considered.  Dailey Buccheri Paulita FujitaMartin Vandy Tsuchiya, M.D. [facility]  [place] [address] [place], [place]  [id number] [phone] phone [phone]) [phone] fax

## 2015-01-04 ENCOUNTER — Encounter: Payer: Self-pay | Admitting: Family Medicine

## 2015-01-05 LAB — URINE CULTURE: Colony Count: >=100000

## 2015-01-07 ENCOUNTER — Encounter: Payer: Self-pay | Admitting: Family Medicine

## 2015-01-08 MED ORDER — ALBUTEROL SULFATE (2.5 MG/3ML) 0.083% IN NEBU: 2.5000 mg | INHALATION_SOLUTION | Freq: Four times a day (QID) | RESPIRATORY_TRACT | Status: DC | PRN

## 2015-01-09 NOTE — Addendum Note (Signed)
Addended by: Margretta DittyWEAVER, Despina Boan M on: 01/09/2015 01:30 PM   Modules accepted: Orders

## 2015-01-12 ENCOUNTER — Ambulatory Visit (INDEPENDENT_AMBULATORY_CARE_PROVIDER_SITE_OTHER): Payer: BLUE CROSS/BLUE SHIELD | Admitting: Family Medicine

## 2015-01-12 ENCOUNTER — Ambulatory Visit (INDEPENDENT_AMBULATORY_CARE_PROVIDER_SITE_OTHER): Payer: BLUE CROSS/BLUE SHIELD

## 2015-01-12 ENCOUNTER — Telehealth: Payer: Self-pay

## 2015-01-12 VITALS — BP 122/80 | HR 132 | Temp 97.9°F

## 2015-01-12 DIAGNOSIS — R0789 Other chest pain: Secondary | ICD-10-CM

## 2015-01-12 DIAGNOSIS — R05 Cough: Secondary | ICD-10-CM

## 2015-01-12 DIAGNOSIS — J22 Unspecified acute lower respiratory infection: Secondary | ICD-10-CM

## 2015-01-12 DIAGNOSIS — R059 Cough, unspecified: Secondary | ICD-10-CM

## 2015-01-12 DIAGNOSIS — J988 Other specified respiratory disorders: Secondary | ICD-10-CM | POA: Diagnosis not present

## 2015-01-12 DIAGNOSIS — J9801 Acute bronchospasm: Secondary | ICD-10-CM

## 2015-01-12 LAB — POCT CBC
GRANULOCYTE PERCENT: 87.2 % — AB (ref 37–80)
HCT, POC: 45.4 % (ref 37.7–47.9)
HEMOGLOBIN: 14.6 g/dL (ref 12.2–16.2)
Lymph, poc: 1.4 (ref 0.6–3.4)
MCH, POC: 27.8 pg (ref 27–31.2)
MCHC: 32.1 g/dL (ref 31.8–35.4)
MCV: 86.4 fL (ref 80–97)
MID (cbc): 0.2 (ref 0–0.9)
MPV: 7.7 fL (ref 0–99.8)
PLATELET COUNT, POC: 336 10*3/uL (ref 142–424)
POC GRANULOCYTE: 11.1 — AB (ref 2–6.9)
POC LYMPH %: 11.3 % (ref 10–50)
POC MID %: 1.5 %M (ref 0–12)
RBC: 5.26 M/uL (ref 4.04–5.48)
RDW, POC: 14.6 %
WBC: 12.7 10*3/uL — AB (ref 4.6–10.2)

## 2015-01-12 NOTE — Progress Notes (Signed)
Subjective:    Patient ID: Carol Zavala, female    DOB: 06/12/1981, 34 y.o.   MRN: [id number]  01/12/2015  Chest Pain; Shortness of Breath; and Back Pain   HPI This 34 y.o. female presents for nine day follow-up for lower respiratory infection with bronchospasm.  Evaluated on 01/03/15; s/p CXR and CBC; rx for Levaquin and Prednisone provided; advised to continue Albuterol nebulizer tid at home.  Presenting today .  Was doing better but now not doing better.  Has two more doses of Levaquin and Prednisone.   Fiance was sick this week with pneumonia and pt has been taking care of both of them.  Cough worsened mid-week.  Now having chest pain that is shooting through to back.  Sharp shooting pain, soreness, SOB.  Don't feel like allergic reaction to prednisone or Levaquin.  No fever but +chills and sweats.  +HA since being sick.  No sore throat and ear pain.  Still lots of sinus pressure; which has not improved.  +rhinorrhea with worsening; clear.  +nasal congestion; clear.  SOB yesterday morning; no wheezing.  Tightness like asthma.  Used nebulizer without improvement.   No sputum.  No v/d.  Jeff/fiance is on abx for pneumonia; fiance diagnosed with pneumonia three days ago.  Taking Tylenol Cold & Sinus; Tylenol Extreme.  Had been taking Zyrtec that does nothing.  No sneezing; no itchy eyes or itchy nose.    Missed one week in March with onset.  Plans to call out two days (Monday and Tuesday)   Review of Systems  Constitutional: Positive for chills, diaphoresis and fatigue. Negative for fever.  HENT: Positive for congestion, postnasal drip, rhinorrhea and sinus pressure. Negative for ear pain, sneezing and sore throat.   Respiratory: Positive for cough, shortness of breath and wheezing.   Cardiovascular: Positive for chest pain. Negative for palpitations and leg swelling.  Gastrointestinal: Negative for nausea, vomiting, abdominal pain and diarrhea.  Skin: Negative for rash.  Neurological:  Positive for headaches. Negative for dizziness and light-headedness.    Past Medical History  Diagnosis Date  . Asthma   . Anxiety   . Hydrocephalus, congenital   . Spina bifida    Past Surgical History  Procedure Laterality Date  . Appendectomy    . Eye surgery    . Abdominal hysterectomy    . Spine surgery    . Tonsillectomy and adenoidectomy    . 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 Latex Anaphylaxis  . Neomycin Anaphylaxis  . Azithromycin Diarrhea  . Ciprofloxacin Other (See Comments)  . Doxycycline Itching  . Suprax [Cefixime] Other (See Comments)  . Septra [Sulfamethoxazole-Trimethoprim] Rash   Current Outpatient Prescriptions  Medication Sig Dispense Refill  . albuterol (PROVENTIL) (2.5 MG/3ML) 0.083% nebulizer solution Take 3 mLs (2.5 mg total) by nebulization every 6 (six) hours as needed for wheezing or shortness of breath. 75 mL 0  . cetirizine (ZYRTEC) 10 MG tablet Take 10 mg by mouth daily.    Marland Kitchen levofloxacin (LEVAQUIN) 500 MG tablet Take 1 tablet (500 mg total) by mouth daily. 10 tablet 0  . predniSONE (DELTASONE) 20 MG tablet Two tablets daily x 5 days then one tablet daily x 5 days 15 tablet 0  . ranitidine (ZANTAC) 150 MG tablet Take 1 tablet (150 mg total) by mouth 2 (two) times daily. 28 tablet 0  . zonisamide (ZONEGRAN) 100 MG capsule Take 3 capsules  at bedtime 90 capsule 1   No current facility-administered medications for this visit.       Objective:    BP 122/80 mmHg  Pulse 132  Temp(Src) 97.9 F (36.6 C) (Oral)  Ht   Wt   SpO2 99% Physical Exam  Constitutional: She is oriented to person, place, and time. She appears well-developed and well-nourished. No distress.  HENT:  Head: Normocephalic and atraumatic.  Right Ear: Tympanic membrane, external ear and ear canal normal.  Left Ear: Tympanic membrane, external ear and ear canal normal.  Nose: Mucosal edema and  rhinorrhea present. Right sinus exhibits maxillary sinus tenderness and frontal sinus tenderness. Left sinus exhibits maxillary sinus tenderness and frontal sinus tenderness.  Eyes: Conjunctivae are normal. Pupils are equal, round, and reactive to light.  Neck: Normal range of motion. Neck supple.  Cardiovascular: Regular rhythm and normal heart sounds.  Tachycardia present.  Exam reveals no gallop and no friction rub.   No murmur heard. 102 heart rate  Pulmonary/Chest: Effort normal and breath sounds normal. No accessory muscle usage. No tachypnea. No respiratory distress. She has no decreased breath sounds. She has no wheezes. She has no rhonchi. She has no rales.  Neurological: She is alert and oriented to person, place, and time.  Skin: She is not diaphoretic.  Psychiatric: She has a normal mood and affect. Her behavior is normal.  Nursing note and vitals reviewed.  Results for orders placed or performed in visit on 01/12/15  POCT CBC  Result Value Ref Range   WBC 12.7 (A) 4.6 - 10.2 K/uL   Lymph, poc 1.4 0.6 - 3.4   POC LYMPH PERCENT 11.3 10 - 50 %L   MID (cbc) 0.2 0 - 0.9   POC MID % 1.5 0 - 12 %M   POC Granulocyte 11.1 (A) 2 - 6.9   Granulocyte percent 87.2 (A) 37 - 80 %G   RBC 5.26 4.04 - 5.48 M/uL   Hemoglobin 14.6 12.2 - 16.2 g/dL   HCT, POC 16.145.4 09.637.7 - 47.9 %   MCV 86.4 80 - 97 fL   MCH, POC 27.8 27 - 31.2 pg   MCHC 32.1 31.8 - 35.4 g/dL   RDW, POC 04.514.6 %   Platelet Count, POC 336 142 - 424 K/uL   MPV 7.7 0 - 99.8 fL   EKG: sinus tachy at 108; no ST changes.  CXR:  NAD    Assessment & Plan:   1. Other chest pain   2. Lower respiratory infection   3. Cough   4. Bronchospasm     1. Chest pain:  New.  Secondary to chronic coughing and bronchospasm.  Supportive care with Delsym or Mucinex DM for cough. 2.  Lower respiratory infection: persistent; exposed to acute illness this week as was starting to improve; complete Levaquin and Prednisone; continue Albuterol but  increase to tid; add Mucinex DM. 3.  Bronchospasm: Persistent; complete Prednisone taper; increase Albuterol to tid.   No orders of the defined types were placed in this encounter.    No Follow-up on file.   Kristi Paulita FujitaMartin Smith, M.D. [facility]  [place] [address] [place], [place]  [id number] [phone] phone [phone] fax

## 2015-01-12 NOTE — Telephone Encounter (Signed)
Pt states she still has some medication left but still filling bad and has been exposed to pnemonia but not running a fever  Please call

## 2015-01-14 NOTE — Telephone Encounter (Signed)
lmom to cb. 

## 2015-01-18 ENCOUNTER — Encounter: Payer: Self-pay | Admitting: Family Medicine

## 2015-02-18 ENCOUNTER — Telehealth: Payer: Self-pay | Admitting: Family Medicine

## 2015-02-18 NOTE — Telephone Encounter (Signed)
Lmovm to return my call. 

## 2015-02-19 ENCOUNTER — Telehealth: Payer: Self-pay | Admitting: Neurology

## 2015-02-19 NOTE — Telephone Encounter (Signed)
Lmovm to return my call. 

## 2015-02-19 NOTE — Telephone Encounter (Signed)
Per VM, pt is returning call. Please call at [phone] / Sherri S.

## 2015-02-20 MED ORDER — ZONISAMIDE 100 MG PO CAPS: ORAL_CAPSULE | ORAL | Status: DC

## 2015-02-20 NOTE — Telephone Encounter (Signed)
Patient returned my call. Explained that we received refill request for her zonegran & notified her that she was due a return office visit. Follow-up appt scheduled with Dr. Karel JarvisAquino for 7/8 @ 4pm. Refill faxed to her pharmacy.

## 2015-04-12 ENCOUNTER — Encounter: Payer: Self-pay | Admitting: Neurology

## 2015-04-12 ENCOUNTER — Ambulatory Visit (INDEPENDENT_AMBULATORY_CARE_PROVIDER_SITE_OTHER): Payer: BLUE CROSS/BLUE SHIELD | Admitting: Neurology

## 2015-04-12 VITALS — BP 112/78 | HR 116 | Resp 16

## 2015-04-12 DIAGNOSIS — G919 Hydrocephalus, unspecified: Secondary | ICD-10-CM | POA: Diagnosis not present

## 2015-04-12 DIAGNOSIS — G40209 Localization-related (focal) (partial) symptomatic epilepsy and epileptic syndromes with complex partial seizures, not intractable, without status epilepticus: Secondary | ICD-10-CM | POA: Diagnosis not present

## 2015-04-12 DIAGNOSIS — Q059 Spina bifida, unspecified: Secondary | ICD-10-CM

## 2015-04-12 MED ORDER — ZONISAMIDE 100 MG PO CAPS: ORAL_CAPSULE | ORAL | Status: DC

## 2015-04-12 NOTE — Progress Notes (Signed)
NEUROLOGY FOLLOW UP OFFICE NOTE  Serina CowperHeather P Ebbert [id number]  HISTORY OF PRESENT ILLNESS: I had the pleasure of seeing Carol Zavala in follow-up in the neurology clinic on 04/12/2015.  The patient was last seen a year ago after a nocturnal convulsion on 02/12/14. She was started on Keppra but had worsening headaches, switched to Zonegran, which has helped with the headaches and no further seizures since May 2015. She is taking 200mg  qhs, she was concerned about increasing to 300mg  due to daytime drowsiness. She denies any dizziness, diplopia, focal numbness/tingling/weakness, olfactory/gustatory hallucinations, myoclonic jerks. She is tolerating this dose with no significant side effects. She is reporting her left ear feels stopped up, no ear pain. 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   HPI: This is a pleasant 34 yo LH woman with a history of congenital hydrocephalus and spina bifida s/p VP shunt in infancy and several revisions, with a history of 2 nocturnal convulsions that occurred in 2011 and most recently last 02/12/2014. Both were witnessed by her husband. In 2011, they were asleep when she started having whole body shaking. Her husband called her father who lives 20 minutes away, told him she was snoring loudly and could not be aroused. When he arrived, she was sitting up, out of it and confused. She had seen neurologist Dr. Kelli HopeMichael Reynolds, records unavailable for review. According to the patient, she was unable to complete the MRI brain but the partial results were "okay." Her routine EEG was reported normal. She was not started on medication at that time and per patient, a sleep study was considered. There were no further similar symptoms until May 2015, out of sleep when her husband woke up to whole body shaking, drooling and foaming at the mouth. She was confused after and did not know his name,  saying "leave me alone," fighting him. Her father arrived 20 minutes later and found her sitting up in the bed, looking around, no focal arm weakness noted. She bit both sides of her tongue. Her husband reported the episode lasted 15 minutes and was worse than the last one. She recalls having 1 alcoholic drink the night prior to the seizures.  She has "always" had headaches, no change in pattern of frontal pressure, with no associated nausea, vomiting, photo/phonophobia, vision changes. She and her father deny any staring/unresponsive episodes, gaps in time, olfactory/gustatory hallucinations, deja vu, rising epigastric sensation, focal numbness/tingling, myoclonic jerks. 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 No change from baseline. She initially had a VP shunt on the right, then had several revisions due to shunt infection, last shunt placed on the left.   Epilepsy Risk Factors: Congenital hydrocephalus s/p VP shunt. There is no history of febrile convulsions, CNS infections such as meningitis/encephalitis, significant traumatic brain injury, or family history of seizures.   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 There is a shunt catheter seen within the left lateral ventricle entering the posterior aspect of the left parietal bone. The ventricles are normal in caliber. There has been a previous right parietal craniotomy where there is some adjacent right parietal encephalomalacia. There is diffuse thickening of the calvarium. No acute abnormality seen. Routine EEG 03/2014: normal wake  and drowsy EEG  PAST MEDICAL HISTORY: Past Medical History  Diagnosis Date  . Asthma   . Anxiety   . Hydrocephalus, congenital   . Spina bifida     MEDICATIONS: Current  Outpatient Prescriptions on File Prior to Visit  Medication Sig Dispense Refill  . ranitidine (ZANTAC) 150 MG tablet Take 1 tablet (150 mg total) by mouth 2 (two) times daily. (Patient taking differently: Take 150 mg by mouth as needed. ) 28 tablet 0  . zonisamide (ZONEGRAN) 100 MG capsule Take 3 capsules at bedtime (Patient taking differently: Take 2 capsules at bedtime) 90 capsule 2   No current facility-administered medications on file prior to visit.    ALLERGIES: Allergies  Allergen Reactions  . Latex Anaphylaxis  . Neomycin Anaphylaxis  . Azithromycin Diarrhea  . Ciprofloxacin Other (See Comments)  . Doxycycline Itching  . Suprax [Cefixime] Other (See Comments)  . Septra [Sulfamethoxazole-Trimethoprim] Rash    FAMILY HISTORY: Family History  Problem Relation Age of Onset  . Hyperlipidemia Mother   . Diabetes Father   . Heart disease Father   . Hyperlipidemia Father   . Cancer Maternal Grandmother     BREAST AND UTERINE  . Cancer Maternal Grandfather     LUNG  . Cancer Paternal Grandmother     LUNG  . Cancer Paternal Grandfather     LUNG    SOCIAL HISTORY: History   Social History  . Marital Status: Single    Spouse Name: N/A  . Number of Children: N/A  . Years of Education: N/A   Occupational History  . Not on file.   Social History Main Topics  . Smoking status: Never Smoker   . Smokeless tobacco: Not on file  . Alcohol Use: 2.4 - 3.0 oz/week    4-5 Standard drinks or equivalent per week  . Drug Use: No  . Sexual Activity: Not on file   Other Topics Concern  . Not on file   Social History Narrative    REVIEW OF SYSTEMS: Constitutional: No fevers, chills, or sweats, no generalized fatigue, change in appetite Eyes: No visual changes, double vision, eye pain Ear, nose and throat: No hearing loss, ear pain, nasal congestion, sore throat Cardiovascular: No chest pain, palpitations Respiratory:  No shortness of breath at rest or with exertion,  wheezes GastrointestinaI: No nausea, vomiting, diarrhea, abdominal pain, fecal incontinence Genitourinary:  No dysuria, urinary retention or frequency Musculoskeletal:  No neck pain, back pain Integumentary: No rash, pruritus, skin lesions Neurological: as above Psychiatric: No depression, insomnia, anxiety Endocrine: No palpitations, fatigue, diaphoresis, mood swings, change in appetite, change in weight, increased thirst Hematologic/Lymphatic:  No anemia, purpura, petechiae. Allergic/Immunologic: no itchy/runny eyes, nasal congestion, recent allergic reactions, rashes  PHYSICAL EXAM: Filed Vitals:   04/12/15 1554  BP: 112/78  Pulse: 116  Resp: 16   General: No acute distress Head: Normocephalic/atraumatic, earwax in both ears, L>R, unable to visualize TM on left, intact on right Neck: supple, no paraspinal tenderness, full range of motion Heart: Regular rate and rhythm Lungs: Clear to auscultation bilaterally Back: No paraspinal tenderness Skin/Extremities: No rash, no edema Neurological Exam:Mental status: alert and oriented to person, place, and time, no dysarthria or aphasia, Fund of knowledge is appropriate. Recent and remote memory are intact. 3/3 delayed recall. Attention and concentration are normal. Able to name objects and repeat phrases.  Cranial nerves:  CN I: not tested  CN II: pupils equal, round and reactive to light, visual fields intact, fundi unremarkable.  CN III, IV, VI: left esotropia (chronic) with full range of motion, no nystagmus, no ptosis  CN V: facial sensation intact  CN VII: upper and lower face symmetric  CN VIII: hearing intact to finger rub  CN IX, X: gag intact, uvula midline  CN XI: sternocleidomastoid and trapezius muscles intact  CN XII: tongue midline  Bulk & Tone: normal on both UE, hypotonic on both LE, no fasciculations.  Motor: 5/5 on both UE. 0/5 on both LE  Sensation: intact to light touch on both UE. Absent sensation  on both LE.  Deep Tendon Reflexes: +2 on both UE, absent reflexes on both LE. No ankle clonus  Plantar responses: mute bilaterally  Cerebellar: no incoordination on finger to nose  Gait: not tested, paraplegic  Tremor: none  IMPRESSION: This is a pleasant 34 yo LH woman with a history of congenital hydrocephalus and spina bifida s/p VP shunt, who had 2 nocturnal seizures in 2011, then had another nocturnal seizure last 02/12/2014. Her routine EEG is normal. 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 No further seizures since May 2015, currently on Zonegran  qhs with no significant side effects. She will continue on current dose and knows to call our office for any changes.She does not drive. No pregnancy plans, she had a hysterectomy. 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 She will follow-up in 1 year and knows to call our office for any changes.    Thank you for allowing me to participate in her care.  Please do not hesitate to call for any questions or concerns.  The duration of this appointment visit was 26 minutes of face-to-face time with the patient.  Greater than 50% of this time was spent in counseling, explanation of diagnosis, planning of further management, and coordination of care.   Patrcia Dolly, M.D.   CC: Dr. Katrinka Blazing

## 2015-04-12 NOTE — Patient Instructions (Signed)
1. Continue Zonegran : Take 2 capsules at bedtime 2. Follow-up in 1 year, call our office for any changes

## 2015-11-13 ENCOUNTER — Ambulatory Visit (INDEPENDENT_AMBULATORY_CARE_PROVIDER_SITE_OTHER): Payer: BLUE CROSS/BLUE SHIELD | Admitting: Physician Assistant

## 2015-11-13 ENCOUNTER — Ambulatory Visit (INDEPENDENT_AMBULATORY_CARE_PROVIDER_SITE_OTHER): Payer: BLUE CROSS/BLUE SHIELD

## 2015-11-13 VITALS — BP 136/70 | HR 106 | Temp 98.7°F | Resp 16

## 2015-11-13 DIAGNOSIS — J45909 Unspecified asthma, uncomplicated: Secondary | ICD-10-CM | POA: Diagnosis not present

## 2015-11-13 DIAGNOSIS — R52 Pain, unspecified: Secondary | ICD-10-CM

## 2015-11-13 DIAGNOSIS — R05 Cough: Secondary | ICD-10-CM | POA: Diagnosis not present

## 2015-11-13 DIAGNOSIS — J101 Influenza due to other identified influenza virus with other respiratory manifestations: Secondary | ICD-10-CM | POA: Diagnosis not present

## 2015-11-13 DIAGNOSIS — R0981 Nasal congestion: Secondary | ICD-10-CM | POA: Diagnosis not present

## 2015-11-13 DIAGNOSIS — R059 Cough, unspecified: Secondary | ICD-10-CM

## 2015-11-13 LAB — POCT INFLUENZA A/B
INFLUENZA A, POC: NEGATIVE
INFLUENZA B, POC: POSITIVE — AB

## 2015-11-13 MED ORDER — HYDROCOD POLST-CPM POLST ER 10-8 MG/5ML PO SUER: 5.0000 mL | Freq: Every evening | ORAL | Status: DC | PRN

## 2015-11-13 MED ORDER — ALBUTEROL SULFATE (2.5 MG/3ML) 0.083% IN NEBU: 2.5000 mg | INHALATION_SOLUTION | Freq: Four times a day (QID) | RESPIRATORY_TRACT | Status: AC | PRN

## 2015-11-13 NOTE — Progress Notes (Signed)
[facility] [address] [place] [address] [phone]  Date:  11/13/2015   Name:  Carol Zavala   DOB:  1980-12-05   MRN:  [id number]  PCP:  Allean Found, MD   Chief Complaint  Patient presents with  . Nasal Congestion    all SXs since Sunday  . Headache  . Cough  . Fever    100.9 when SXs started    History of Present Illness:  Carol Zavala is a 35 y.o. female patient who presents to [facility] for congestion, headache, cough and fever.     Coughing started 4 days ago.  This has progressively worsened white sputum.  She has had a fever since 3 days ago.  She has been taking tylenol.  Mucinex was helping, but now only starting diarrhea.  She feels weak.  She has a constant headache along her frontal and maxillary.  No ear pain, but mildly pruritic.  Thick mucus that is white.  She feels sob and dyspnea.  She used her inhaler that has helped.  She uses a nebulizer which helps.   Patient Active Problem List   Diagnosis Date Noted  . 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 Seizures (HCC) 02/20/2014  . Hydrocephalus 02/20/2014  . Spina bifida (HCC) 02/20/2014    Past Medical History  Diagnosis Date  . Asthma   . Anxiety   . Hydrocephalus, congenital (HCC)   . Spina bifida [employer] [facility])     Past Surgical History  Procedure Laterality Date  . Appendectomy    . Eye surgery    . Abdominal hysterectomy    . Spine surgery    . Tonsillectomy and adenoidectomy    . 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 Smoking status: Never Smoker   . Smokeless tobacco: None  . Alcohol Use: 2.4 - 3.0 oz/week    4-5 Standard drinks or equivalent per week    Family History  Problem Relation Age of Onset  . Hyperlipidemia Mother   . Diabetes Father   .  Heart disease Father   . Hyperlipidemia Father   . Cancer Maternal Grandmother     BREAST AND UTERINE  . Cancer Maternal Grandfather     LUNG  . Cancer Paternal Grandmother     LUNG  . Cancer Paternal Grandfather     LUNG    Allergies  Allergen Reactions  . Latex Anaphylaxis  . Neomycin Anaphylaxis  . Azithromycin Diarrhea  . Ciprofloxacin Other (See Comments)  . Doxycycline Itching  . Suprax [Cefixime] Other (See Comments)  . Septra [Sulfamethoxazole-Trimethoprim] Rash    Medication list has been reviewed and updated.  Current Outpatient Prescriptions on File Prior to Visit  Medication Sig Dispense Refill  . ranitidine (ZANTAC) 150 MG tablet Take 1 tablet (150 mg total) by mouth 2 (two) times daily. (Patient taking differently: Take 150 mg by mouth as needed. ) 28 tablet 0  . zonisamide (ZONEGRAN) 100 MG capsule Take 2 capsules at bedtime 180 capsule 3   No current facility-administered medications on file prior to visit.    ROS ROS otherwise unremarkable unless listed above.   Physical Examination: BP 136/70 mmHg  Pulse 106  Temp(Src) 98.7 F (37.1 C) (Oral)  Resp 16  Ht   Wt   SpO2 97% Ideal Body  Weight:    Physical Exam  Constitutional: She is oriented to person, place, and time. She appears well-developed and well-nourished. No distress.  HENT:  Head: Normocephalic and atraumatic.  Right Ear: Tympanic membrane, external ear and ear canal normal.  Left Ear: Tympanic membrane, external ear and ear canal normal.  Nose: Mucosal edema and rhinorrhea present. Right sinus exhibits no maxillary sinus tenderness and no frontal sinus tenderness. Left sinus exhibits no maxillary sinus tenderness and no frontal sinus tenderness.  Mouth/Throat: No uvula swelling. No oropharyngeal exudate, posterior oropharyngeal edema or posterior oropharyngeal erythema.  Eyes: Conjunctivae and EOM are normal. Pupils are equal, round, and reactive to light.  Cardiovascular: Normal rate  and regular rhythm.  Exam reveals no gallop, no distant heart sounds and no friction rub.   No murmur heard. Pulmonary/Chest: Effort normal. No respiratory distress. She has no decreased breath sounds. She has no wheezes. She has no rhonchi.  Lymphadenopathy:       Head (right side): No submandibular, no tonsillar, no preauricular and no posterior auricular adenopathy present.       Head (left side): No submandibular, no tonsillar, no preauricular and no posterior auricular adenopathy present.  Neurological: She is alert and oriented to person, place, and time.  Skin: She is not diaphoretic.  Psychiatric: She has a normal mood and affect. Her behavior is normal.   Results for orders placed or performed in visit on 11/13/15  POCT Influenza A/B  Result Value Ref Range   Influenza A, POC Negative Negative   Influenza B, POC Positive (A) Negative   Dg Chest 2 View  11/13/2015  CLINICAL DATA:  35 year old female with cough and fever for 4 days. Congestion. Initial encounter. EXAM: CHEST  2 VIEW COMPARISON:  01/12/2015. FINDINGS: Large body habitus. Low normal lung volumes. Normal cardiac size and mediastinal contours. Visualized tracheal air column is within normal limits. No pneumothorax, pulmonary edema, pleural effusion or confluent pulmonary opacity. Partially visible chronic shunt tubing in about the left lower neck and clavicle. Chronic cerclage wires about the lower thoracic posterior elements. No acute osseous abnormality identified. IMPRESSION: No acute cardiopulmonary abnormality. Electronically Signed   By: Odessa Fleming M.D.   On: 11/13/2015 10:37   Assessment and Plan: Carol Zavala is a 35 y.o. female who is here today for congestion, cough, and body aches.   She is out of the window of treatment.  Treating supportively.    Refilled nebulizing treatment.  Advised contact if sxs continue after 4 days.   Influenza B - Plan: chlorpheniramine-HYDROcodone (TUSSIONEX PENNKINETIC ER) 10-8  MG/5ML SUER  Congestion of nasal sinus - Plan: POCT Influenza A/B, DG Chest 2 View  Cough - Plan: POCT Influenza A/B, DG Chest 2 View, chlorpheniramine-HYDROcodone (TUSSIONEX PENNKINETIC ER) 10-8 MG/5ML SUER  Body aches - Plan: POCT Influenza A/B, DG Chest 2 View  Asthma, unspecified asthma severity, uncomplicated - Plan: albuterol (PROVENTIL) (2.5 MG/3ML) 0.083% nebulizer solution  Trena Platt, PA-C [facility] 2/9/20177:53 AM

## 2015-11-13 NOTE — Patient Instructions (Addendum)
Because you received an x-ray today, you will receive an invoice from [facility]. Please contact [facility] at [phone] with questions or concerns regarding your invoice. Our billing staff will not be able to assist you with those questions.  Please hydrate well with water of 64 oz per day. You can take mucinex, and take tylenol/ibuprofen for fevers. If you continue to have symptoms passed 5 days without improvement, then contact me.     Influenza, Adult Influenza ("the flu") is a viral infection of the respiratory tract. It occurs more often in winter months because people spend more time in close contact with one another. Influenza can make you feel very sick. Influenza easily spreads from person to person (contagious). CAUSES  Influenza is caused by a virus that infects the respiratory tract. You can catch the virus by breathing in droplets from an infected person's cough or sneeze. You can also catch the virus by touching something that was recently contaminated with the virus and then touching your mouth, nose, or eyes. RISKS AND COMPLICATIONS You may be at risk for a more severe case of influenza if you smoke cigarettes, have diabetes, have chronic heart disease (such as heart failure) or lung disease (such as asthma), or if you have a weakened immune system. Elderly people and pregnant women are also at risk for more serious infections. The most common problem of influenza is a lung infection (pneumonia). Sometimes, this problem can require emergency medical care and may be life threatening. SIGNS AND SYMPTOMS  Symptoms typically last 4 to 10 days and may include:  Fever.  Chills.  Headache, body aches, and muscle aches.  Sore throat.  Chest discomfort and cough.  Poor appetite.  Weakness or feeling tired.  Dizziness.  Nausea or vomiting. DIAGNOSIS  Diagnosis of influenza is often made based on your history and a physical exam. A nose or throat swab  test can be done to confirm the diagnosis. TREATMENT  In mild cases, influenza goes away on its own. Treatment is directed at relieving symptoms. For more severe cases, your health care provider may prescribe antiviral medicines to shorten the sickness. Antibiotic medicines are not effective because the infection is caused by a virus, not by bacteria. HOME CARE INSTRUCTIONS  Take medicines only as directed by your health care provider.  Use a cool mist humidifier to make breathing easier.  Get plenty of rest until your temperature returns to normal. This usually takes 3 to 4 days.  Drink enough fluid to keep your urine clear or pale yellow.  Cover yourmouth and nosewhen coughing or sneezing,and wash your handswellto prevent thevirusfrom spreading.  Stay homefromwork orschool untilthe fever is gonefor at least 75full day. PREVENTION  An annual influenza vaccination (flu shot) is the best way to avoid getting influenza. An annual flu shot is now routinely recommended for all adults in the [place] SEEK MEDICAL CARE IF:  You experiencechest pain, yourcough worsens,or you producemore mucus.  Youhave nausea,vomiting, ordiarrhea.  Your fever returns or gets worse. SEEK IMMEDIATE MEDICAL CARE IF:  You havetrouble breathing, you become short of breath,or your skin ornails becomebluish.  You have severe painor stiffnessin the neck.  You develop a sudden headache, or pain in the face or ear.  You have nausea or vomiting that you cannot control. MAKE SURE YOU:   Understand these instructions.  Will watch your condition.  Will get help right away if you are not doing well or get worse.   This information is not  intended to replace advice given to you by your health care provider. Make sure you discuss any questions you have with your health care provider.   Document Released: 09/18/2000 Document Revised: 10/12/2014 Document Reviewed: 12/21/2011 Elsevier  Interactive Patient Education [employer]

## 2015-11-19 ENCOUNTER — Telehealth: Payer: Self-pay

## 2015-11-19 ENCOUNTER — Encounter: Payer: Self-pay | Admitting: Physician Assistant

## 2015-11-19 NOTE — Telephone Encounter (Signed)
Left message for patient that if she is still using Tussionex (cough syrup) she should stop as this can cause sedation. If she still has fatigue, I do think patient needs to come in for recheck. Influenza can lead to pneumonia and fatigue can be a symptom of that. Please let patient know.

## 2015-11-19 NOTE — Telephone Encounter (Signed)
Pt states she was diagnosed with Pneumonia and even though she's feeling better is still very weak and wanted to know if that was normal. Pt was advised to come back for a recheck but would Prefer a call back at [phone]

## 2016-05-22 ENCOUNTER — Other Ambulatory Visit: Payer: Self-pay | Admitting: Neurology

## 2016-05-22 DIAGNOSIS — G40209 Localization-related (focal) (partial) symptomatic epilepsy and epileptic syndromes with complex partial seizures, not intractable, without status epilepticus: Secondary | ICD-10-CM

## 2016-05-25 ENCOUNTER — Encounter: Payer: Self-pay | Admitting: *Deleted

## 2016-08-25 ENCOUNTER — Other Ambulatory Visit: Payer: Self-pay | Admitting: Neurology

## 2016-08-25 DIAGNOSIS — G40209 Localization-related (focal) (partial) symptomatic epilepsy and epileptic syndromes with complex partial seizures, not intractable, without status epilepticus: Secondary | ICD-10-CM

## 2016-08-25 NOTE — Telephone Encounter (Signed)
Can you pls call her and ask if she is still taking the medication? If yes, does she still want us to fill, or her PCP to do it? If she wants us to do it, she has to come for annual visits. Thanks

## 2016-08-25 NOTE — Telephone Encounter (Signed)
Contacted patient. Notified her of below and she made an appointment for 10/06/16 at 3:45.

## 2016-08-25 NOTE — Telephone Encounter (Signed)
Patients last OV 04/12/15. Refill and mychart message sent to schedule follow-up sent 05/25/16. Okay to refill?

## 2016-10-06 ENCOUNTER — Ambulatory Visit (INDEPENDENT_AMBULATORY_CARE_PROVIDER_SITE_OTHER): Payer: BLUE CROSS/BLUE SHIELD | Admitting: Neurology

## 2016-10-06 ENCOUNTER — Encounter: Payer: Self-pay | Admitting: Neurology

## 2016-10-06 VITALS — BP 116/72 | HR 110

## 2016-10-06 DIAGNOSIS — G40209 Localization-related (focal) (partial) symptomatic epilepsy and epileptic syndromes with complex partial seizures, not intractable, without status epilepticus: Secondary | ICD-10-CM | POA: Diagnosis not present

## 2016-10-06 DIAGNOSIS — G4730 Sleep apnea, unspecified: Secondary | ICD-10-CM

## 2016-10-06 MED ORDER — ZONISAMIDE 100 MG PO CAPS: ORAL_CAPSULE | ORAL | 11 refills | Status: DC

## 2016-10-06 NOTE — Progress Notes (Signed)
NEUROLOGY FOLLOW UP OFFICE NOTE  Carol Zavala  HISTORY OF PRESENT ILLNESS: I had the pleasure of seeing Carol Zavala in follow-up in the neurology clinic on 10/06/2016.  The patient was last seen a year ago after a nocturnal convulsion on 02/12/14. She was started on Keppra but had worsening headaches, switched to Zonegran, with no further seizures since May 2015. She is taking 200mg  qhs, with no significant side effects. She has occasional headaches that are more related to bruxism and sinus issues. 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 She denies any nocturnal seizures but sometimes wakes up feeling like she is catching her breath or that her teeth are chattering but she is not cold. She has daytime drowsiness and snores, and sometimes does not feel rested on awakening. She feels focused on the Zonisamide but is tired a lot. She denies any staring/unresponsive episodes, gaps in time, focal numbness/tingling/weakness.  HPI: This is a pleasant 36 yo LH woman with a history of congenital hydrocephalus and spina bifida s/p VP shunt in infancy and several revisions, with a history of 2 nocturnal convulsions that occurred in 2011 and most recently last 02/12/2014. Both were witnessed by her husband. In 2011, they were asleep when she started having whole body shaking. Her husband called her father who lives 20 minutes away, told him she was snoring loudly and could not be aroused. When he arrived, she was sitting up, out of it and confused. She had seen neurologist Dr. Kelli HopeMichael Reynolds, records unavailable for review. According to the patient, she was unable to complete the MRI brain but the partial results were "okay." Her routine EEG was reported normal. She was not started on medication at that time and per patient, a sleep study was considered. There were no further similar  symptoms until May 2015, out of sleep when her husband woke up to whole body shaking, drooling and foaming at the mouth. She was confused after and did not know his name, saying "leave me alone," fighting him. Her father arrived 20 minutes later and found her sitting up in the bed, looking around, no focal arm weakness noted. She bit both sides of her tongue. Her husband reported the episode lasted 15 minutes and was worse than the last one. She recalls having 1 alcoholic drink the night prior to the seizures.  She has "always" had headaches, no change in pattern of frontal pressure, with no associated nausea, vomiting, photo/phonophobia, vision changes. She and her father deny any staring/unresponsive episodes, gaps in time, olfactory/gustatory hallucinations, deja vu, rising epigastric sensation, focal numbness/tingling, myoclonic jerks. 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 No change from baseline. She initially had a VP shunt on the right, then had several revisions due to shunt infection, last shunt placed on the left.   Epilepsy Risk Factors: Congenital hydrocephalus s/p VP shunt. There is no history of febrile convulsions, CNS infections such as meningitis/encephalitis, significant traumatic brain injury, or family history of seizures.   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 There is a shunt catheter seen within the left lateral ventricle entering the posterior aspect of the left parietal bone. The ventricles are normal in  caliber. There has been a previous right parietal craniotomy where there is some adjacent right parietal encephalomalacia. There is diffuse thickening of the calvarium. No acute abnormality seen. Routine EEG 03/2014: normal wake and drowsy  EEG  PAST MEDICAL HISTORY: Past Medical History:  Diagnosis Date  . Anxiety   . Asthma   . Hydrocephalus, congenital (HCC)   . Spina bifida [facility])     MEDICATIONS: Current Outpatient Prescriptions on File Prior to Visit  Medication Sig Dispense Refill  . albuterol (PROVENTIL) (2.5 MG/3ML) 0.083% nebulizer solution Take 3 mLs (2.5 mg total) by nebulization every 6 (six) hours as needed for wheezing or shortness of breath. 150 mL 1  . ranitidine (ZANTAC) 150 MG tablet Take 1 tablet (150 mg total) by mouth 2 (two) times daily. 28 tablet 0  . zonisamide (ZONEGRAN) 100 MG capsule TAKE 2 CAPSULES BY MOUTH DAILY  AT BEDTIME 60 capsule 2  . chlorpheniramine-HYDROcodone (TUSSIONEX PENNKINETIC ER) 10-8 MG/5ML SUER Take 5 mLs by mouth at bedtime as needed. (Patient not taking: Reported on 10/06/2016) 80 mL 0   No current facility-administered medications on file prior to visit.     ALLERGIES: Allergies  Allergen Reactions  . Latex Anaphylaxis  . Neomycin Anaphylaxis  . Azithromycin Diarrhea  . Ciprofloxacin Other (See Comments)  . Doxycycline Itching  . Suprax [Cefixime] Other (See Comments)  . Septra [Sulfamethoxazole-Trimethoprim] Rash    FAMILY HISTORY: Family History  Problem Relation Age of Onset  . Hyperlipidemia Mother   . Diabetes Father   . Heart disease Father   . Hyperlipidemia Father   . Cancer Maternal Grandmother     BREAST AND UTERINE  . Cancer Maternal Grandfather     LUNG  . Cancer Paternal Grandmother     LUNG  . Cancer Paternal Grandfather     LUNG    SOCIAL HISTORY: Social History   Social History  . Marital status: Single    Spouse name: N/A  . Number of children: N/A  . Years of education: N/A   Occupational History  . Not on file.   Social History Main Topics  . Smoking status: Never Smoker  . Smokeless tobacco: Not on file  . Alcohol use 2.4 - 3.0 oz/week    4 - 5 Standard drinks or equivalent per week  . Drug use: No  . Sexual activity:  Not on file   Other Topics Concern  . Not on file   Social History Narrative  . No narrative on file    REVIEW OF SYSTEMS: Constitutional: No fevers, chills, or sweats, no generalized fatigue, change in appetite Eyes: No visual changes, double vision, eye pain Ear, nose and throat: No hearing loss, ear pain, nasal congestion, sore throat Cardiovascular: No chest pain, palpitations Respiratory:  No shortness of breath at rest or with exertion, wheezes GastrointestinaI: No nausea, vomiting, diarrhea, abdominal pain, fecal incontinence Genitourinary:  No dysuria, urinary retention or frequency Musculoskeletal:  No neck pain, back pain Integumentary: No rash, pruritus, skin lesions Neurological: as above Psychiatric: No depression, insomnia, anxiety Endocrine: No palpitations, fatigue, diaphoresis, mood swings, change in appetite, change in weight, increased thirst Hematologic/Lymphatic:  No anemia, purpura, petechiae. Allergic/Immunologic: no itchy/runny eyes, nasal congestion, recent allergic reactions, rashes  PHYSICAL EXAM: Vitals:   10/06/16 1545  BP: 116/72  Pulse: (!) 110   General: No acute distress Head: Normocephalic/atraumatic Neck: supple, no paraspinal tenderness, full range of motion Heart: Regular rate and rhythm Lungs: Clear to auscultation bilaterally Back: No paraspinal  tenderness Skin/Extremities: No rash, no edema Neurological Exam:Mental status: alert and oriented to person, place, and time, no dysarthria or aphasia, Fund of knowledge is appropriate. Recent and remote memory are intact. 2/3 delayed recall. Attention and concentration are normal. Able to name objects and repeat phrases.  Cranial nerves:  CN I: not tested  CN II: pupils equal, round and reactive to light, visual fields intact, fundi unremarkable.  CN III, IV, VI: left esotropia (chronic) with full range of motion, no nystagmus, no ptosis  CN V: facial sensation intact  CN VII: upper  and lower face symmetric  CN VIII: hearing intact to finger rub  CN IX, X: gag intact, uvula midline  CN XI: sternocleidomastoid and trapezius muscles intact  CN XII: tongue midline  Bulk & Tone: normal on both UE, hypotonic on both LE, no fasciculations.  Motor: 5/5 on both UE. 0/5 on both LE  Sensation: intact to light touch on both UE. Absent sensation on both LE.  Deep Tendon Reflexes: +2 on both UE, absent reflexes on both LE. No ankle clonus  Plantar responses: mute bilaterally  Cerebellar: no incoordination on finger to nose  Gait: not tested, paraplegic  Tremor: none  IMPRESSION: This is a pleasant 36 yo LH woman with a history of congenital hydrocephalus and spina bifida s/p VP shunt, who had 2 nocturnal seizures in 2011, then had another nocturnal seizure last 02/12/2014. Her routine EEG is normal. 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 No further seizures since May 2015, on Zonegran 200mg  qhs with no significant side effects. She is interested in tapering off Zonisamide, and wonders if the nighttime events she described are seizures, however they are more suggestive of sleep apnea with waking up catching her breath, snoring, daytime drowsiness. A sleep study will be ordered. If abnormal, she will be referred to Sleep medicine. We will do another EEG, if normal, will start weaning off Zonisamide, understanding risks of recurrent seizures with any medication adjustment. If sleep study normal, would continue on Zonisamide. She does not drive. She will follow-up in 1 year and knows to call our office for any changes.    Thank you for allowing me to participate in her care.  Please do not hesitate to call for any questions or concerns.  The duration of this appointment visit was 25 minutes of face-to-face time with the patient.  Greater than 50% of this time was spent in counseling, explanation of diagnosis, planning of further management, and coordination of  care.   Patrcia Dolly, M.D.   CC: Dr. Katrinka Blazing

## 2016-10-06 NOTE — Patient Instructions (Signed)
1. Schedule home sleep study 2. Continue Zonisamide 200mg  at night 3. Depending on results of sleep study, we will discuss next steps 4. Follow-up in 1 year, call for any changes  Seizure Precautions: 1. If medication has been prescribed for you to prevent seizures, take it exactly as directed.  Do not stop taking the medicine without talking to your doctor first, even if you have not had a seizure in a long time.   2. Avoid activities in which a seizure would cause danger to yourself or to others.  Don't operate dangerous machinery, swim alone, or climb in high or dangerous places, such as on ladders, roofs, or girders.  Do not drive unless your doctor says you may.  3. If you have any warning that you may have a seizure, lay down in a safe place where you can't hurt yourself.    4.  No driving for 6 months from last seizure, as [place] [place] [place].   Please refer to the following link on the Epilepsy Foundation of America's website for more information: http://www.epilepsyfoundation.org/answerplace/Social/driving/drivingu.cfm   5.  Maintain good sleep hygiene. Avoid alcohol.  6.  Notify your neurology if you are planning pregnancy or if you become pregnant.  7.  Contact your doctor if you have any problems that may be related to the medicine you are taking.  8.  Call 911 and bring the patient back to the ED if:        A.  The seizure lasts longer than 5 minutes.       B.  The patient doesn't awaken shortly after the seizure  C.  The patient has new problems such as difficulty seeing, speaking or moving  D.  The patient was injured during the seizure  E.  The patient has a temperature over 102 F (39C)  F.  The patient vomited and now is having trouble breathing

## 2016-10-16 ENCOUNTER — Other Ambulatory Visit: Payer: Self-pay

## 2016-10-16 ENCOUNTER — Telehealth: Payer: Self-pay | Admitting: Neurology

## 2016-10-16 DIAGNOSIS — G4730 Sleep apnea, unspecified: Secondary | ICD-10-CM

## 2016-10-16 NOTE — Telephone Encounter (Signed)
[facility] Notified patient they are going to call patient and schedule today.

## 2016-10-16 NOTE — Telephone Encounter (Signed)
Patient has never heard from the sleep study ppl please call [phone]

## 2016-10-28 ENCOUNTER — Ambulatory Visit (HOSPITAL_BASED_OUTPATIENT_CLINIC_OR_DEPARTMENT_OTHER): Payer: BLUE CROSS/BLUE SHIELD

## 2016-11-09 ENCOUNTER — Ambulatory Visit (HOSPITAL_BASED_OUTPATIENT_CLINIC_OR_DEPARTMENT_OTHER): Payer: BLUE CROSS/BLUE SHIELD | Attending: Neurology | Admitting: Internal Medicine

## 2016-11-09 DIAGNOSIS — G919 Hydrocephalus, unspecified: Secondary | ICD-10-CM | POA: Diagnosis not present

## 2016-11-09 DIAGNOSIS — R0683 Snoring: Secondary | ICD-10-CM | POA: Insufficient documentation

## 2016-11-09 DIAGNOSIS — R569 Unspecified convulsions: Secondary | ICD-10-CM

## 2016-11-09 DIAGNOSIS — G4719 Other hypersomnia: Secondary | ICD-10-CM | POA: Insufficient documentation

## 2016-11-13 ENCOUNTER — Encounter: Payer: Self-pay | Admitting: Neurology

## 2016-11-17 ENCOUNTER — Other Ambulatory Visit (HOSPITAL_BASED_OUTPATIENT_CLINIC_OR_DEPARTMENT_OTHER): Payer: Self-pay

## 2016-11-17 DIAGNOSIS — G4730 Sleep apnea, unspecified: Secondary | ICD-10-CM

## 2016-11-24 ENCOUNTER — Telehealth: Payer: Self-pay

## 2016-11-24 DIAGNOSIS — G919 Hydrocephalus, unspecified: Secondary | ICD-10-CM

## 2016-11-24 DIAGNOSIS — R569 Unspecified convulsions: Secondary | ICD-10-CM | POA: Diagnosis not present

## 2016-11-24 NOTE — Telephone Encounter (Signed)
Patient notified

## 2016-11-24 NOTE — Telephone Encounter (Signed)
-----   Message from Van ClinesKaren M Aquino, MD sent at 11/24/2016  2:41 PM EST ----- Regarding: sleep study results Pls let her know the sleep study was normal, no evidence of sleep apnea. Thanks

## 2016-11-24 NOTE — Procedures (Signed)
   Patient Name: [name], Carol Study Date: 11/09/2016 Gender: Female D.O.B: [birth year] Age (years): 35 Referring Provider: Van ClinesKaren M Aquino Height (inches): 57 Interpreting Physician: Jetty Duhamellinton Sinthia Karabin MD, ABSM Weight (lbs): 136 RPSGT: Strong Sink[place]ksdale, [place] BMI: 29 MRN: [id number] Neck Size: 14.50 CLINICAL INFORMATION Sleep Study Type: unattended HST     Indication for sleep study: snoring , excessive daytime somnolence     Epworth Sleepiness Score: 6  SLEEP STUDY TECHNIQUE A multi-channel overnight portable sleep study was performed. The channels recorded were: nasal airflow, thoracic respiratory movement, and oxygen saturation with a pulse oximetry. Snoring was also monitored.  MEDICATIONS Patient self administered medications include: none reported.  SLEEP ARCHITECTURE Patient was studied for 367.1 minutes. The sleep efficiency was 98.3 % and the patient was supine for 20.9%. The arousal index was 0.0 per hour.  RESPIRATORY PARAMETERS The overall AHI was 2.0 per hour, with a central apnea index of 0.0 per hour.  The oxygen nadir was 82% during sleep.  CARDIAC DATA Mean heart rate during sleep was 98.8 bpm.  IMPRESSIONS - No significant obstructive sleep apnea occurred during this study (AHI = 2.0/h). - No significant central sleep apnea occurred during this study (CAI = 0.0/h). - Moderate oxygen desaturation was noted during this study (Min O2 = 82%, Mean saturation 95%). - Patient snored.  DIAGNOSIS - Normal study - Primary Snoring (786.09 [R06.83 ICD-10])  RECOMMENDATIONS - Avoid alcohol, sedatives and other CNS depressants that may worsen sleep apnea and disrupt normal sleep architecture. - Sleep hygiene should be reviewed to assess factors that may improve sleep quality. - Weight management and regular exercise should be initiated or continued. - Patient may benefit from in-lab study if questions remain.  [Electronically signed] 11/24/2016 02:27  PM  Jetty Duhamellinton Ashleynicole Mcclees MD, ABSM Diplomate, American Board of Sleep Medicine   NPI: [id number] Waymon BudgeYOUNG,Dabney Schanz D Diplomate, American Board of Sleep Medicine  ELECTRONICALLY SIGNED ON:  11/24/2016, 2:21 PM [facility] PH: [phone]   FX: [phone] ACCREDITED BY THE AMERICAN ACADEMY OF SLEEP MEDICINE

## 2017-05-27 ENCOUNTER — Ambulatory Visit (INDEPENDENT_AMBULATORY_CARE_PROVIDER_SITE_OTHER): Payer: BLUE CROSS/BLUE SHIELD | Admitting: Emergency Medicine

## 2017-05-27 ENCOUNTER — Encounter: Payer: Self-pay | Admitting: Emergency Medicine

## 2017-05-27 VITALS — BP 118/86 | HR 104 | Temp 98.4°F | Resp 16

## 2017-05-27 DIAGNOSIS — N390 Urinary tract infection, site not specified: Secondary | ICD-10-CM

## 2017-05-27 DIAGNOSIS — R8290 Unspecified abnormal findings in urine: Secondary | ICD-10-CM | POA: Diagnosis not present

## 2017-05-27 DIAGNOSIS — R531 Weakness: Secondary | ICD-10-CM | POA: Diagnosis not present

## 2017-05-27 LAB — POCT URINALYSIS DIP (MANUAL ENTRY)
BILIRUBIN UA: NEGATIVE
Glucose, UA: NEGATIVE mg/dL
NITRITE UA: NEGATIVE
PH UA: 6.5 (ref 5.0–8.0)
PROTEIN UA: NEGATIVE mg/dL
Spec Grav, UA: 1.01 (ref 1.010–1.025)
Urobilinogen, UA: 0.2 E.U./dL

## 2017-05-27 LAB — POC MICROSCOPIC URINALYSIS (UMFC): Mucus: ABSENT

## 2017-05-27 MED ORDER — LEVOFLOXACIN 500 MG PO TABS: 500.0000 mg | ORAL_TABLET | Freq: Every day | ORAL | 0 refills | Status: DC

## 2017-05-27 NOTE — Patient Instructions (Addendum)
     IF you received an x-ray today, you will receive an invoice from [facility]. Please contact [facility] at [phone] with questions or concerns regarding your invoice.   IF you received labwork today, you will receive an invoice from [facility]. Please contact [facility] at [phone] with questions or concerns regarding your invoice.   Our billing staff will not be able to assist you with questions regarding bills from these companies.  You will be contacted with the lab results as soon as they are available. The fastest way to get your results is to activate your My Chart account. Instructions are located on the last page of this paperwork. If you have not heard from us regarding the results in 2 weeks, please contact this office.     Urinary Tract Infection, Adult A urinary tract infection (UTI) is an infection of any part of the urinary tract. The urinary tract includes the:  Kidneys.  Ureters.  Bladder.  Urethra.  These organs make, store, and get rid of pee (urine) in the body. Follow these instructions at home:  Take over-the-counter and prescription medicines only as told by your doctor.  If you were prescribed an antibiotic medicine, take it as told by your doctor. Do not stop taking the antibiotic even if you start to feel better.  Avoid the following drinks: ? Alcohol. ? Caffeine. ? Tea. ? Carbonated drinks.  Drink enough fluid to keep your pee clear or pale yellow.  Keep all follow-up visits as told by your doctor. This is important.  Make sure to: ? Empty your bladder often and completely. Do not to hold pee for long periods of time. ? Empty your bladder before and after sex. ? Wipe from front to back after a bowel movement if you are female. Use each tissue one time when you wipe. Contact a doctor if:  You have back pain.  You have a fever.  You feel sick to your stomach (nauseous).  You throw up (vomit).  Your symptoms do  not get better after 3 days.  Your symptoms go away and then come back. Get help right away if:  You have very bad back pain.  You have very bad lower belly (abdominal) pain.  You are throwing up and cannot keep down any medicines or water. This information is not intended to replace advice given to you by your health care provider. Make sure you discuss any questions you have with your health care provider. Document Released: 03/09/2008 Document Revised: 02/27/2016 Document Reviewed: 08/12/2015 Elsevier Interactive Patient Education  2018 Elsevier Inc.  

## 2017-05-27 NOTE — Assessment & Plan Note (Signed)
Multiple allergies; has taken Levaquin before without problems; urine culture sent; cultures in the past reviewed; multiple organisms most likely colonization.

## 2017-05-27 NOTE — Progress Notes (Signed)
Carol Zavala 36 y.o.   Chief Complaint  Patient presents with  . Abdominal Pain    lower area started today  . Weakness    x 3 days  . Urine Odor    X 1 week    HISTORY OF PRESENT ILLNESS: This is a 36 y.o. female paraplegic due to spina bifida c/o general weakness x 3 days along with lower abdominal discomfort and odor to her urine; self catheterizes daily. Still eating and drinking; denies fever, n/v, chills, or any other significant symptoms.  HPI   Prior to Admission medications   Medication Sig Start Date End Date Taking? Authorizing Provider  albuterol (PROVENTIL) (2.5 MG/3ML) 0.083% nebulizer solution Take 3 mLs (2.5 mg total) by nebulization every 6 (six) hours as needed for wheezing or shortness of breath. 11/13/15  Yes English, Judeth Cornfield D, PA  zonisamide (ZONEGRAN) 100 MG capsule TAKE 2 CAPSULES BY MOUTH DAILY  AT BEDTIME 10/06/16  Yes Van Clines, MD  levofloxacin (LEVAQUIN) 500 MG tablet Take 1 tablet (500 mg total) by mouth daily. 05/27/17   Georgina Quint, MD  ranitidine (ZANTAC) 150 MG tablet Take 1 tablet (150 mg total) by mouth 2 (two) times daily. Patient not taking: Reported on 05/27/2017 07/08/14   Gwyneth Sprout, MD    Allergies  Allergen Reactions  . Latex Anaphylaxis  . Neomycin Anaphylaxis  . Azithromycin Diarrhea  . Ciprofloxacin Other (See Comments)  . Doxycycline Itching  . Suprax [Cefixime] Other (See Comments)  . Septra [Sulfamethoxazole-Trimethoprim] Rash    Patient Active Problem List   Diagnosis Date Noted  . 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 Seizures (HCC) 02/20/2014  . Hydrocephalus 02/20/2014  . Spina bifida (HCC) 02/20/2014    Past Medical History:  Diagnosis Date  . Anxiety   . Asthma   . Hydrocephalus, congenital (HCC)   . Spina bifida [facility])     Past Surgical History:  Procedure  Laterality Date  . ABDOMINAL HYSTERECTOMY    . APPENDECTOMY    . EYE SURGERY    . SPINE SURGERY    . TONSILLECTOMY AND ADENOIDECTOMY    . 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 Marital status: Single    Spouse name: N/A  . Number of children: N/A  . Years of education: N/A   Occupational History  . Not on file.   Social History Main Topics  . Smoking status: Never Smoker  . Smokeless tobacco: Never Used  . Alcohol use 2.4 - 3.0 oz/week    4 - 5 Standard drinks or equivalent per week  . Drug use: No  . Sexual activity: Not on file   Other Topics Concern  . Not on file   Social History Narrative  . No narrative on file    Family History  Problem Relation Age of Onset  . Hyperlipidemia Mother   . Diabetes Father   . Heart disease Father   . Hyperlipidemia Father   . Cancer Maternal Grandmother        BREAST AND UTERINE  . Cancer Maternal Grandfather        LUNG  . Cancer Paternal Grandmother        LUNG  . Cancer Paternal Grandfather        LUNG     Review of Systems  Constitutional: Positive for  malaise/fatigue. Negative for chills and fever.  HENT: Negative.   Eyes: Negative.   Respiratory: Negative for cough and shortness of breath.   Cardiovascular: Negative.  Negative for chest pain and palpitations.  Gastrointestinal: Negative for abdominal pain, diarrhea, nausea and vomiting.  Genitourinary: Negative for flank pain and hematuria.  Skin: Negative.  Negative for rash.  Neurological: Positive for weakness. Negative for dizziness and headaches.  Endo/Heme/Allergies: Negative.   All other systems reviewed and are negative.   Vitals:   05/27/17 1210  BP: 118/86  Pulse: (!) 104  Resp: 16  Temp: 98.4 F (36.9 C)  SpO2: 100%    Physical Exam  Constitutional: She is oriented to person, place, and time. She appears well-developed and well-nourished.  Wheelchair bound.    HENT:  Head: Normocephalic and atraumatic.  Eyes: Pupils are equal, round, and reactive to light. Conjunctivae and EOM are normal.  Neck: Normal range of motion. Neck supple.  Cardiovascular: Normal rate, regular rhythm and normal heart sounds.   Pulmonary/Chest: Effort normal and breath sounds normal.  Abdominal: Soft. Bowel sounds are normal. She exhibits no distension. There is no tenderness.  Neurological: She is alert and oriented to person, place, and time.  Skin: Skin is warm and dry. Capillary refill takes less than 2 seconds. No rash noted.  Psychiatric: She has a normal mood and affect. Her behavior is normal.  Vitals reviewed.   Results for orders placed or performed in visit on 05/27/17 (from the past 24 hour(s))  POCT urinalysis dipstick     Status: Abnormal   Collection Time: 05/27/17 12:33 PM  Result Value Ref Range   Color, UA yellow yellow   Clarity, UA clear clear   Glucose, UA negative negative mg/dL   Bilirubin, UA negative negative   Ketones, POC UA trace (5) (A) negative mg/dL   Spec Grav, UA 3.524 8.185 - 1.025   Blood, UA trace-lysed (A) negative   pH, UA 6.5 5.0 - 8.0   Protein Ur, POC negative negative mg/dL   Urobilinogen, UA 0.2 0.2 or 1.0 E.U./dL   Nitrite, UA Negative Negative   Leukocytes, UA Small (1+) (A) Negative  POCT Microscopic Urinalysis ([facility])     Status: Abnormal   Collection Time: 05/27/17 12:37 PM  Result Value Ref Range   WBC,UR,HPF,POC Moderate (A) None WBC/hpf   RBC,UR,HPF,POC None None RBC/hpf   Bacteria Many (A) None, Too numerous to count   Mucus Absent Absent   Epithelial Cells, UR Per Microscopy Few (A) None, Too numerous to count cells/hpf  Acute UTI Multiple allergies; has taken Levaquin before without problems; urine culture sent; cultures in the past reviewed; multiple organisms most likely colonization.    ASSESSMENT & PLAN: Maritere was seen today for abdominal pain, weakness and urine odor.  Diagnoses and all  orders for this visit:  Acute UTI  Abnormal urine odor -     POCT urinalysis dipstick -     POCT Microscopic Urinalysis ([facility]) -     Urine Culture  General weakness  Other orders -     levofloxacin (LEVAQUIN) 500 MG tablet; Take 1 tablet (500 mg total) by mouth daily.    Patient Instructions       IF you received an x-ray today, you will receive an invoice from [facility]. Please contact [facility] at [phone] with questions or concerns regarding your invoice.   IF you received labwork today, you will receive an invoice from [facility]. Please contact [facility] at [phone] with  questions or concerns regarding your invoice.   Our billing staff will not be able to assist you with questions regarding bills from these companies.  You will be contacted with the lab results as soon as they are available. The fastest way to get your results is to activate your My Chart account. Instructions are located on the last page of this paperwork. If you have not heard from [place] regarding the results in 2 weeks, please contact this office.     Urinary Tract Infection, Adult A urinary tract infection (UTI) is an infection of any part of the urinary tract. The urinary tract includes the:  Kidneys.  Ureters.  Bladder.  Urethra.  These organs make, store, and get rid of pee (urine) in the body. Follow these instructions at home:  Take over-the-counter and prescription medicines only as told by your doctor.  If you were prescribed an antibiotic medicine, take it as told by your doctor. Do not stop taking the antibiotic even if you start to feel better.  Avoid the following drinks: ? Alcohol. ? Caffeine. ? Tea. ? Carbonated drinks.  Drink enough fluid to keep your pee clear or pale yellow.  Keep all follow-up visits as told by your doctor. This is important.  Make sure to: ? Empty your bladder often and completely. Do not to hold pee for long periods of  time. ? Empty your bladder before and after sex. ? Wipe from front to back after a bowel movement if you are female. Use each tissue one time when you wipe. Contact a doctor if:  You have back pain.  You have a fever.  You feel sick to your stomach (nauseous).  You throw up (vomit).  Your symptoms do not get better after 3 days.  Your symptoms go away and then come back. Get help right away if:  You have very bad back pain.  You have very bad lower belly (abdominal) pain.  You are throwing up and cannot keep down any medicines or water. This information is not intended to replace advice given to you by your health care provider. Make sure you discuss any questions you have with your health care provider. Document Released: 03/09/2008 Document Revised: 02/27/2016 Document Reviewed: 08/12/2015 Elsevier Interactive Patient Education  2018 Elsevier Inc.      Edwina Barth, MD [facility]

## 2017-05-29 LAB — URINE CULTURE

## 2017-05-31 ENCOUNTER — Encounter: Payer: Self-pay | Admitting: Emergency Medicine

## 2017-10-08 ENCOUNTER — Ambulatory Visit: Payer: BLUE CROSS/BLUE SHIELD | Admitting: Neurology

## 2017-10-15 ENCOUNTER — Other Ambulatory Visit: Payer: Self-pay | Admitting: Neurology

## 2017-10-15 DIAGNOSIS — G40209 Localization-related (focal) (partial) symptomatic epilepsy and epileptic syndromes with complex partial seizures, not intractable, without status epilepticus: Secondary | ICD-10-CM

## 2017-12-20 ENCOUNTER — Other Ambulatory Visit: Payer: Self-pay

## 2017-12-20 ENCOUNTER — Ambulatory Visit: Payer: BLUE CROSS/BLUE SHIELD | Admitting: Neurology

## 2017-12-20 ENCOUNTER — Encounter: Payer: Self-pay | Admitting: Neurology

## 2017-12-20 VITALS — BP 104/76 | HR 107 | Ht <= 58 in

## 2017-12-20 DIAGNOSIS — G40209 Localization-related (focal) (partial) symptomatic epilepsy and epileptic syndromes with complex partial seizures, not intractable, without status epilepticus: Secondary | ICD-10-CM | POA: Diagnosis not present

## 2017-12-20 DIAGNOSIS — R0683 Snoring: Secondary | ICD-10-CM | POA: Diagnosis not present

## 2017-12-20 MED ORDER — ZONISAMIDE 100 MG PO CAPS: ORAL_CAPSULE | ORAL | 3 refills | Status: DC

## 2017-12-20 NOTE — Patient Instructions (Signed)
1. Continue Zonisamide 200mg  at bedtime 2. Schedule 1-hour sleep-deprived EEG. Our office will call you with results and further instructions for Zonisamide 3. Let us know if you would like to proceed with in-lab sleep study 4. Follow-up in 1 year, call for any changes  Seizure Precautions: 1. If medication has been prescribed for you to prevent seizures, take it exactly as directed.  Do not stop taking the medicine without talking to your doctor first, even if you have not had a seizure in a long time.   2. Avoid activities in which a seizure would cause danger to yourself or to others.  Don't operate dangerous machinery, swim alone, or climb in high or dangerous places, such as on ladders, roofs, or girders.  Do not drive unless your doctor says you may.  3. If you have any warning that you may have a seizure, lay down in a safe place where you can't hurt yourself.    4.  No driving for 6 months from last seizure, [place].   Please refer to the following link on the Epile[employer] of America's website for more information: http://www.epilepsyfoundation.org/answerplace/Social/driving/drivingu.cfm   5.  Maintain good sleep hygiene. Avoid alcohol.  6.  Notify your neurology if you are planning pregnancy or if you become pregnant.  7.  Contact your doctor if you have any problems that may be related to the medicine you are taking.  8.  Call 911 and bring the patient back to the ED if:        A.  The seizure lasts longer than 5 minutes.       B.  The patient doesn't awaken shortly after the seizure  C.  The patient has new problems such as difficulty seeing, speaking or moving  D.  The patient was injured during the seizure  E.  The patient has a temperature over 102 F (39C)  F.  The patient vomited and now is having trouble breathing
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
## 2018-01-03 ENCOUNTER — Ambulatory Visit (INDEPENDENT_AMBULATORY_CARE_PROVIDER_SITE_OTHER): Payer: BLUE CROSS/BLUE SHIELD | Admitting: Neurology

## 2018-01-03 DIAGNOSIS — G40209 Localization-related (focal) (partial) symptomatic epilepsy and epileptic syndromes with complex partial seizures, not intractable, without status epilepticus: Secondary | ICD-10-CM

## 2018-01-03 DIAGNOSIS — R0683 Snoring: Secondary | ICD-10-CM

## 2018-01-04 NOTE — Procedures (Signed)
ELECTROENCEPHALOGRAM REPORT  Date of Study: 01/03/2018  Patient's Name: Carol CowperHeather P Awwad MRN: [id number] Date of Birth: [birth year]  Referring Provider: Dr. Patrcia DollyKaren Jaidyn Kuhl  Clinical History: This is a 37 year old woman with seizures in 2011 and 2015, interested in tapering off Zonisamide. History of VP shunt on left parietal region, right parietal craniotomy.  Medications: ZONEGRAN 100 MG capsule  PROVENTIL (2.5 MG/3ML) 0.083%  ZANTAC 150 MG tablet     Technical Summary: A multichannel digital 1-hour sleep-deprived EEG recording measured by the international 10-20 system with electrodes applied with paste and impedances below 5000 ohms performed in our laboratory with EKG monitoring in an awake and asleep patient.  Hyperventilation was not performed. Photic stimulation was performed.  The digital EEG was referentially recorded, reformatted, and digitally filtered in a variety of bipolar and referential montages for optimal display.    Description: The patient is awake and asleep during the recording.  During maximal wakefulness, there is a symmetric, medium voltage 9-9.5 Hz posterior dominant rhythm that attenuates with eye opening.  The record is symmetric.  During drowsiness and sleep, there is an increase in theta slowing of the background, at times with higher amplitude and sharply contoured wave forms over the left temporal region.  Vertex waves and symmetric sleep spindles were seen.  Hyperventilation and photic stimulation did not elicit any abnormalities.  There were no epileptiform discharges or electrographic seizures seen.    EKG lead was unremarkable.  Impression: This 1-hour awake and asleep EEG is mildly abnormal due to breach artifact over the left temporal region.  Clinical Correlation of the above findings is consistent with surgery in this region. The absence of epileptiform discharges does not exclude a clinical diagnosis of epilepsy.  If further clinical questions  remain, prolonged EEG may be helpful.  Clinical correlation is advised.   Patrcia DollyKaren Myeshia Fojtik, M.D.

## 2018-01-11 ENCOUNTER — Encounter: Payer: Self-pay | Admitting: Neurology

## 2018-01-11 ENCOUNTER — Telehealth: Payer: Self-pay | Admitting: Neurology

## 2018-01-11 NOTE — Telephone Encounter (Signed)
Discussed EEG findings with patient, findings were reflective of shunt, no epileptiform discharges. We can try tapering off medication if she would like, understanding risk of breakthrough seizure with any medication adjustment. She would like to try. Instructions given to taper off ZNS 100mg  1 cap every night for 2 weeks, then 1 cap every other night for 2 weeks, then stop. She knows to call our office for any changes.

## 2018-02-02 ENCOUNTER — Ambulatory Visit: Payer: BLUE CROSS/BLUE SHIELD | Admitting: Emergency Medicine

## 2018-02-02 ENCOUNTER — Other Ambulatory Visit: Payer: Self-pay

## 2018-02-02 ENCOUNTER — Encounter: Payer: Self-pay | Admitting: Emergency Medicine

## 2018-02-02 VITALS — BP 110/74 | HR 99 | Temp 98.6°F | Resp 16

## 2018-02-02 DIAGNOSIS — T24212A Burn of second degree of left thigh, initial encounter: Secondary | ICD-10-CM

## 2018-02-02 MED ORDER — MUPIROCIN 2 % EX OINT
TOPICAL_OINTMENT | CUTANEOUS | 1 refills | Status: DC
Start: 2018-02-02 — End: 2018-02-02

## 2018-02-02 MED ORDER — MUPIROCIN 2 % EX OINT: TOPICAL_OINTMENT | CUTANEOUS | 1 refills | Status: DC
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
## 2018-02-02 NOTE — Patient Instructions (Addendum)
   IF you received an x-ray today, you will receive an invoice from [facility]. Please contact [facility] at [phone] with questions or concerns regarding your invoice.   IF you received labwork today, you will receive an invoice from [facility]. Please contact [facility] at [phone] with questions or concerns regarding your invoice.   Our billing staff will not be able to assist you with questions regarding bills from these companies.  You will be contacted with the lab results as soon as they are available. The fastest way to get your results is to activate your My Chart account. Instructions are located on the last page of this paperwork. If you have not heard from us regarding the results in 2 weeks, please contact this office.     Burn Care, Adult A burn is an injury to the skin or the tissues under the skin. There are three types of burns:  First degree. These burns may cause the skin to be red and a bit swollen.  Second degree. These burns are very painful and cause the skin to be very red. The skin may also leak fluid, look shiny, and start to have blisters.  Third degree. These burns cause permanent damage. They turn the skin white or black and make it look charred, dry, and leathery.  Taking care of your burn properly can help to prevent pain and infection. It can also help the burn to heal more quickly. How is this treated? Right after a burn:  Rinse or soak the burn under cool water. Do this for several minutes. Do not put ice on your burn. That can cause more damage.  Lightly cover the burn with a clean (sterile) cloth (dressing). Burn care  Raise (elevate) the injured area above the level of your heart while sitting or lying down.  Follow instructions from your doctor about: ? How to clean and take care of the burn. ? When to change and remove the cloth.  Check your burn every day for signs of infection. Check for: ? More redness,  swelling, or pain. ? Warmth. ? Pus or a bad smell. Medicine   Take over-the-counter and prescription medicines only as told by your doctor.  If you were prescribed antibiotic medicine, take or apply it as told by your doctor. Do not stop using the antibiotic even if your condition improves. General instructions  To prevent infection: ? Do not put butter, oil, or other home treatments on the burn. ? Do not scratch or pick at the burn. ? Do not break any blisters. ? Do not peel skin.  Do not rub your burn, even when you are cleaning it.  Protect your burn from the sun. Contact a doctor if:  Your condition does not get better.  Your condition gets worse.  You have a fever.  Your burn looks different or starts to have black or red spots on it.  Your burn feels warm to the touch.  Your pain is not controlled with medicine. Get help right away if:  You have redness, swelling, or pain at the site of the burn.  You have fluid, blood, or pus coming from your burn.  You have red streaks near the burn.  You have very bad pain. This information is not intended to replace advice given to you by your health care provider. Make sure you discuss any questions you have with your health care provider. Document Released: 06/30/2008 Document Revised: 11/07/2016 Document Reviewed: 03/10/2016 Elsevier Interactive   Patient Education  2018 Elsevier Inc.  

## 2018-02-07 ENCOUNTER — Ambulatory Visit: Payer: BLUE CROSS/BLUE SHIELD | Admitting: Family Medicine

## 2018-02-08 ENCOUNTER — Encounter: Payer: Self-pay | Admitting: Emergency Medicine

## 2018-02-08 ENCOUNTER — Ambulatory Visit: Payer: BLUE CROSS/BLUE SHIELD | Admitting: Emergency Medicine

## 2018-02-08 VITALS — BP 110/78 | HR 125 | Temp 98.2°F | Resp 17

## 2018-02-08 DIAGNOSIS — J069 Acute upper respiratory infection, unspecified: Secondary | ICD-10-CM

## 2018-02-08 DIAGNOSIS — T24212D Burn of second degree of left thigh, subsequent encounter: Secondary | ICD-10-CM | POA: Diagnosis not present

## 2018-02-08 MED ORDER — DOXYCYCLINE HYCLATE 100 MG PO TABS: 100.0000 mg | ORAL_TABLET | Freq: Two times a day (BID) | ORAL | 0 refills | Status: DC

## 2018-02-08 NOTE — Progress Notes (Signed)
Carol Zavala 37 y.o.   Chief Complaint  Patient presents with  . Leg Injury    burn on leg     HISTORY OF PRESENT ILLNESS: This is a 37 y.o. female here for recheck of overall second-degree burn to her left upper thigh.  Seen by me 02/02/2018 and started on Bactroban.  She feels that it is a little worse and that he may be getting infected.  3 days ago she had a fever that lasted 24 hours and is better now.  In addition she had URI symptoms with sore throat, sinus congestion, and cough.  No other significant symptoms.  Has multiple allergies to different antibiotics.  However has taken doxycycline without any significant problems.  HPI   Prior to Admission medications   Medication Sig Start Date End Date Taking? Authorizing Provider  albuterol (PROVENTIL) (2.5 MG/3ML) 0.083% nebulizer solution Take 3 mLs (2.5 mg total) by nebulization every 6 (six) hours as needed for wheezing or shortness of breath. 11/13/15  Yes English, Judeth Cornfield D, PA  MELATONIN PO Take by mouth at bedtime.   Yes [provider]  mupirocin ointment (BACTROBAN) 2 % Sig to affected area twice a day x 7 days. 02/02/18  Yes Cecile Guevara, Eilleen Kempf, MD  ranitidine (ZANTAC) 150 MG tablet Take 1 tablet (150 mg total) by mouth 2 (two) times daily. 07/08/14  Yes Plunkett, Alphonzo Lemmings, MD  zonisamide (ZONEGRAN) 100 MG capsule TAKE TWO CAPSULES BY MOUTH AT BEDTIME Patient not taking: Reported on 02/08/2018 12/20/17   Van Clines, MD    Allergies  Allergen Reactions  . Latex Anaphylaxis  . Neomycin Anaphylaxis  . Azithromycin Diarrhea  . Ciprofloxacin Other (See Comments)  . Doxycycline Itching  . Suprax [Cefixime] Other (See Comments)  . Septra [Sulfamethoxazole-Trimethoprim] Rash    Patient Active Problem List   Diagnosis Date Noted  . Second degree burn of left thigh 02/02/2018  . Acute UTI 05/27/2017  . Abnormal urine odor 05/27/2017  . General weakness 05/27/2017  . Localization-related symptomatic epilepsy  and epileptic syndromes with complex partial seizures, not intractable, without status epilepticus (HCC) 04/12/2015  . Seizures (HCC) 02/20/2014  . Hydrocephalus 02/20/2014  . Spina bifida (HCC) 02/20/2014    Past Medical History:  Diagnosis Date  . Anxiety   . Asthma   . Hydrocephalus, congenital (HCC)   . Spina bifida [facility])     Past Surgical History:  Procedure Laterality Date  . ABDOMINAL HYSTERECTOMY    . APPENDECTOMY    . EYE SURGERY    . SPINE SURGERY    . TONSILLECTOMY AND ADENOIDECTOMY    . 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 Marital status: Single    Spouse name: Not on file  . Number of children: Not on file  . Years of education: Not on file  . Highest education level: Not on file  Occupational History  . Not on file  Social Needs  . Financial resource strain: Not on file  . Food insecurity:    Worry: Not on file    Inability: Not on file  . Transportation needs:    Medical: Not on file    Non-medical: Not on file  Tobacco Use  . Smoking status: Never Smoker  . Smokeless tobacco: Never Used  Substance and Sexual Activity  . Alcohol use: Yes    Alcohol/week: 2.4 - 3.0 oz    Types: 4 -  5 Standard drinks or equivalent per week  . Drug use: No  . Sexual activity: Not on file  Lifestyle  . Physical activity:    Days per week: Not on file    Minutes per session: Not on file  . Stress: Not on file  Relationships  . Social connections:    Talks on phone: Not on file    Gets together: Not on file    Attends religious service: Not on file    Active member of club or organization: Not on file    Attends meetings of clubs or organizations: Not on file    Relationship status: Not on file  . Intimate partner violence:    Fear of current or ex partner: Not on file    Emotionally abused: Not on file    Physically abused: Not on file    Forced sexual activity: Not on  file  Other Topics Concern  . Not on file  Social History Narrative  . Not on file    Family History  Problem Relation Age of Onset  . Hyperlipidemia Mother   . Diabetes Father   . Heart disease Father   . Hyperlipidemia Father   . Cancer Maternal Grandmother        BREAST AND UTERINE  . Cancer Maternal Grandfather        LUNG  . Cancer Paternal Grandmother        LUNG  . Cancer Paternal Grandfather        LUNG     Review of Systems  Constitutional: Positive for fever.  HENT: Positive for congestion and sore throat.   Eyes: Negative for discharge and redness.  Respiratory: Positive for cough. Negative for shortness of breath and wheezing.   Cardiovascular: Negative for chest pain.  Gastrointestinal: Negative for abdominal pain, diarrhea, nausea and vomiting.  Genitourinary: Negative.  Negative for dysuria, frequency, hematuria and urgency.  Musculoskeletal: Negative.  Negative for back pain, myalgias and neck pain.  Neurological: Negative.  Negative for dizziness and headaches.  Endo/Heme/Allergies: Negative.   All other systems reviewed and are negative.  Vitals:   02/08/18 0840  BP: 110/78  Pulse: (!) 125  Resp: 17  Temp: 98.2 F (36.8 C)  SpO2: 98%     Physical Exam  Constitutional: She is oriented to person, place, and time. She appears well-developed and well-nourished.  HENT:  Head: Normocephalic and atraumatic.  Right Ear: External ear normal.  Left Ear: External ear normal.  Nose: Nose normal.  Mouth/Throat: Oropharynx is clear and moist.  Eyes: Pupils are equal, round, and reactive to light. Conjunctivae and EOM are normal.  Neck: Normal range of motion. Neck supple. No JVD present. No thyromegaly present.  Cardiovascular: Normal rate, regular rhythm and normal heart sounds.  Pulmonary/Chest: Effort normal and breath sounds normal.  Abdominal: Soft. There is no tenderness.  Lymphadenopathy:    She has no cervical adenopathy.  Neurological: She  is alert and oriented to person, place, and time.  Skin: Skin is warm and dry.  See picture below.  Psychiatric: She has a normal mood and affect. Her behavior is normal.  Vitals reviewed.  .    ASSESSMENT & PLAN: Demetri was seen today for leg injury.  Diagnoses and all orders for this visit:  Partial thickness burn of left thigh, subsequent encounter Comments: with early infection  Orders: -     doxycycline (VIBRA-TABS) 100 MG tablet; Take 1 tablet (100 mg total) by mouth 2 (two) times  daily.  Acute upper respiratory infection   Return in 3 days for reevaluation.  Patient Instructions       IF you received an x-ray today, you will receive an invoice from [facility]. Please contact [facility] at [phone] with questions or concerns regarding your invoice.   IF you received labwork today, you will receive an invoice from [facility]. Please contact [facility] at [phone] with questions or concerns regarding your invoice.   Our billing staff will not be able to assist you with questions regarding bills from these companies.  You will be contacted with the lab results as soon as they are available. The fastest way to get your results is to activate your My Chart account. Instructions are located on the last page of this paperwork. If you have not heard from [place] regarding the results in 2 weeks, please contact this office.      Upper Respiratory Infection, Adult Most upper respiratory infections (URIs) are caused by a virus. A URI affects the nose, throat, and upper air passages. The most common type of URI is often called "the common cold." Follow these instructions at home:  Take medicines only as told by your doctor.  Gargle warm saltwater or take cough drops to comfort your throat as told by your doctor.  Use a warm mist humidifier or inhale steam from a shower to increase air moisture. This may make it easier to breathe.  Drink enough fluid  to keep your pee (urine) clear or pale yellow.  Eat soups and other clear broths.  Have a healthy diet.  Rest as needed.  Go back to work when your fever is gone or your doctor says it is okay. ? You may need to stay home longer to avoid giving your URI to others. ? You can also wear a face mask and wash your hands often to prevent spread of the virus.  Use your inhaler more if you have asthma.  Do not use any tobacco products, including cigarettes, chewing tobacco, or electronic cigarettes. If you need help quitting, ask your doctor. Contact a doctor if:  You are getting worse, not better.  Your symptoms are not helped by medicine.  You have chills.  You are getting more short of breath.  You have brown or red mucus.  You have yellow or brown discharge from your nose.  You have pain in your face, especially when you bend forward.  You have a fever.  You have puffy (swollen) neck glands.  You have pain while swallowing.  You have white areas in the back of your throat. Get help right away if:  You have very bad or constant: ? Headache. ? Ear pain. ? Pain in your forehead, behind your eyes, and over your cheekbones (sinus pain). ? Chest pain.  You have long-lasting (chronic) lung disease and any of the following: ? Wheezing. ? Long-lasting cough. ? Coughing up blood. ? A change in your usual mucus.  You have a stiff neck.  You have changes in your: ? Vision. ? Hearing. ? Thinking. ? Mood. This information is not intended to replace advice given to you by your health care provider. Make sure you discuss any questions you have with your health care provider. Document Released: 03/09/2008 Document Revised: 05/24/2016 Document Reviewed: 12/27/2013 Elsevier Interactive Patient Education  2018 Elsevier Inc.  Burn Care, Adult A burn is an injury to the skin or the tissues under the skin. There are three types of burns:  First degree. These burns may cause  the skin to be red and a bit swollen.  Second degree. These burns are very painful and cause the skin to be very red. The skin may also leak fluid, look shiny, and start to have blisters.  Third degree. These burns cause permanent damage. They turn the skin white or black and make it look charred, dry, and leathery.  Taking care of your burn properly can help to prevent pain and infection. It can also help the burn to heal more quickly. How is this treated? Right after a burn:  Rinse or soak the burn under cool water. Do this for several minutes. Do not put ice on your burn. That can cause more damage.  Lightly cover the burn with a clean (sterile) cloth (dressing). Burn care  Raise (elevate) the injured area above the level of your heart while sitting or lying down.  Follow instructions from your doctor about: ? How to clean and take care of the burn. ? When to change and remove the cloth.  Check your burn every day for signs of infection. Check for: ? More redness, swelling, or pain. ? Warmth. ? Pus or a bad smell. Medicine   Take over-the-counter and prescription medicines only as told by your doctor.  If you were prescribed antibiotic medicine, take or apply it as told by your doctor. Do not stop using the antibiotic even if your condition improves. General instructions  To prevent infection: ? Do not put butter, oil, or other home treatments on the burn. ? Do not scratch or pick at the burn. ? Do not break any blisters. ? Do not peel skin.  Do not rub your burn, even when you are cleaning it.  Protect your burn from the sun. Contact a doctor if:  Your condition does not get better.  Your condition gets worse.  You have a fever.  Your burn looks different or starts to have black or red spots on it.  Your burn feels warm to the touch.  Your pain is not controlled with medicine. Get help right away if:  You have redness, swelling, or pain at the site of the  burn.  You have fluid, blood, or pus coming from your burn.  You have red streaks near the burn.  You have very bad pain. This information is not intended to replace advice given to you by your health care provider. Make sure you discuss any questions you have with your health care provider. Document Released: 06/30/2008 Document Revised: 11/07/2016 Document Reviewed: 03/10/2016 Elsevier Interactive Patient Education  2018 Elsevier Inc.     Edwina Barth, MD [facility]

## 2018-02-08 NOTE — Patient Instructions (Addendum)
IF you received an x-ray today, you will receive an invoice from [facility]. Please contact [facility] at [phone] with questions or concerns regarding your invoice.   IF you received labwork today, you will receive an invoice from [facility]. Please contact [facility] at [phone] with questions or concerns regarding your invoice.   Our billing staff will not be able to assist you with questions regarding bills from these companies.  You will be contacted with the lab results as soon as they are available. The fastest way to get your results is to activate your My Chart account. Instructions are located on the last page of this paperwork. If you have not heard from [place] regarding the results in 2 weeks, please contact this office.      Upper Respiratory Infection, Adult Most upper respiratory infections (URIs) are caused by a virus. A URI affects the nose, throat, and upper air passages. The most common type of URI is often called "the common cold." Follow these instructions at home:  Take medicines only as told by your doctor.  Gargle warm saltwater or take cough drops to comfort your throat as told by your doctor.  Use a warm mist humidifier or inhale steam from a shower to increase air moisture. This may make it easier to breathe.  Drink enough fluid to keep your pee (urine) clear or pale yellow.  Eat soups and other clear broths.  Have a healthy diet.  Rest as needed.  Go back to work when your fever is gone or your doctor says it is okay. ? You may need to stay home longer to avoid giving your URI to others. ? You can also wear a face mask and wash your hands often to prevent spread of the virus.  Use your inhaler more if you have asthma.  Do not use any tobacco products, including cigarettes, chewing tobacco, or electronic cigarettes. If you need help quitting, ask your doctor. Contact a doctor if:  You are getting worse, not better.  Your  symptoms are not helped by medicine.  You have chills.  You are getting more short of breath.  You have brown or red mucus.  You have yellow or brown discharge from your nose.  You have pain in your face, especially when you bend forward.  You have a fever.  You have puffy (swollen) neck glands.  You have pain while swallowing.  You have white areas in the back of your throat. Get help right away if:  You have very bad or constant: ? Headache. ? Ear pain. ? Pain in your forehead, behind your eyes, and over your cheekbones (sinus pain). ? Chest pain.  You have long-lasting (chronic) lung disease and any of the following: ? Wheezing. ? Long-lasting cough. ? Coughing up blood. ? A change in your usual mucus.  You have a stiff neck.  You have changes in your: ? Vision. ? Hearing. ? Thinking. ? Mood. This information is not intended to replace advice given to you by your health care provider. Make sure you discuss any questions you have with your health care provider. Document Released: 03/09/2008 Document Revised: 05/24/2016 Document Reviewed: 12/27/2013 Elsevier Interactive Patient Education  2018 Elsevier Inc.  Burn Care, Adult A burn is an injury to the skin or the tissues under the skin. There are three types of burns:  First degree. These burns may cause the skin to be red and a bit swollen.  Second degree. These burns  are very painful and cause the skin to be very red. The skin may also leak fluid, look shiny, and start to have blisters.  Third degree. These burns cause permanent damage. They turn the skin white or black and make it look charred, dry, and leathery.  Taking care of your burn properly can help to prevent pain and infection. It can also help the burn to heal more quickly. How is this treated? Right after a burn:  Rinse or soak the burn under cool water. Do this for several minutes. Do not put ice on your burn. That can cause more  damage.  Lightly cover the burn with a clean (sterile) cloth (dressing). Burn care  Raise (elevate) the injured area above the level of your heart while sitting or lying down.  Follow instructions from your doctor about: ? How to clean and take care of the burn. ? When to change and remove the cloth.  Check your burn every day for signs of infection. Check for: ? More redness, swelling, or pain. ? Warmth. ? Pus or a bad smell. Medicine   Take over-the-counter and prescription medicines only as told by your doctor.  If you were prescribed antibiotic medicine, take or apply it as told by your doctor. Do not stop using the antibiotic even if your condition improves. General instructions  To prevent infection: ? Do not put butter, oil, or other home treatments on the burn. ? Do not scratch or pick at the burn. ? Do not break any blisters. ? Do not peel skin.  Do not rub your burn, even when you are cleaning it.  Protect your burn from the sun. Contact a doctor if:  Your condition does not get better.  Your condition gets worse.  You have a fever.  Your burn looks different or starts to have black or red spots on it.  Your burn feels warm to the touch.  Your pain is not controlled with medicine. Get help right away if:  You have redness, swelling, or pain at the site of the burn.  You have fluid, blood, or pus coming from your burn.  You have red streaks near the burn.  You have very bad pain. This information is not intended to replace advice given to you by your health care provider. Make sure you discuss any questions you have with your health care provider. Document Released: 06/30/2008 Document Revised: 11/07/2016 Document Reviewed: 03/10/2016 Elsevier Interactive Patient Education  [employer].

## 2018-02-11 ENCOUNTER — Ambulatory Visit: Payer: BLUE CROSS/BLUE SHIELD | Admitting: Emergency Medicine

## 2018-02-11 ENCOUNTER — Encounter: Payer: Self-pay | Admitting: Emergency Medicine

## 2018-02-11 ENCOUNTER — Other Ambulatory Visit: Payer: Self-pay

## 2018-02-11 VITALS — BP 116/82 | HR 100 | Temp 98.2°F | Resp 16

## 2018-02-11 DIAGNOSIS — T24212D Burn of second degree of left thigh, subsequent encounter: Secondary | ICD-10-CM | POA: Diagnosis not present

## 2018-02-11 NOTE — Progress Notes (Signed)
Carol Zavala 37 y.o.   Chief Complaint  Patient presents with  . Follow-up    burn on left thigh, per pt "still looks the same to me as Tuesday, when I was here."    HISTORY OF PRESENT ILLNESS: This is a 37 y.o. female here today for recheck of burn to her left upper thigh.  States it looks about the same.  URI symptoms much improved.  Still taking the Keflex and using Bactroban topically.  Not worse.  HPI   Prior to Admission medications   Medication Sig Start Date End Date Taking? Authorizing Provider  albuterol (PROVENTIL) (2.5 MG/3ML) 0.083% nebulizer solution Take 3 mLs (2.5 mg total) by nebulization every 6 (six) hours as needed for wheezing or shortness of breath. 11/13/15  Yes English, Judeth Cornfield D, PA  doxycycline (VIBRA-TABS) 100 MG tablet Take 1 tablet (100 mg total) by mouth 2 (two) times daily. 02/08/18  Yes Harsh Trulock, Eilleen Kempf, MD  MELATONIN PO Take by mouth at bedtime.   Yes [provider]  mupirocin ointment (BACTROBAN) 2 % Sig to affected area twice a day x 7 days. 02/02/18  Yes Tarris Delbene, Eilleen Kempf, MD  ranitidine (ZANTAC) 150 MG tablet Take 1 tablet (150 mg total) by mouth 2 (two) times daily. Patient not taking: Reported on 02/11/2018 07/08/14   Gwyneth Sprout, MD  zonisamide (ZONEGRAN) 100 MG capsule TAKE TWO CAPSULES BY MOUTH AT BEDTIME Patient not taking: Reported on 02/08/2018 12/20/17   Van Clines, MD    Allergies  Allergen Reactions  . Latex Anaphylaxis  . Neomycin Anaphylaxis  . Amoxicillin Diarrhea  . Azithromycin Diarrhea  . Ciprofloxacin Other (See Comments)  . Doxycycline Itching  . Keflex [Cephalexin] Diarrhea  . Suprax [Cefixime] Other (See Comments)  . Septra [Sulfamethoxazole-Trimethoprim] Rash    Patient Active Problem List   Diagnosis Date Noted  . Second degree burn of left thigh 02/02/2018  . Acute UTI 05/27/2017  . Abnormal urine odor 05/27/2017  . General weakness 05/27/2017  . Localization-related symptomatic  epilepsy and epileptic syndromes with complex partial seizures, not intractable, without status epilepticus (HCC) 04/12/2015  . Seizures (HCC) 02/20/2014  . Hydrocephalus 02/20/2014  . Spina bifida (HCC) 02/20/2014    Past Medical History:  Diagnosis Date  . Anxiety   . Asthma   . Hydrocephalus, congenital (HCC)   . Spina bifida [facility])     Past Surgical History:  Procedure Laterality Date  . ABDOMINAL HYSTERECTOMY    . APPENDECTOMY    . EYE SURGERY    . SPINE SURGERY    . TONSILLECTOMY AND ADENOIDECTOMY    . 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 Marital status: Single    Spouse name: Not on file  . Number of children: Not on file  . Years of education: Not on file  . Highest education level: Not on file  Occupational History  . Not on file  Social Needs  . Financial resource strain: Not on file  . Food insecurity:    Worry: Not on file    Inability: Not on file  . Transportation needs:    Medical: Not on file    Non-medical: Not on file  Tobacco Use  . Smoking status: Never Smoker  . Smokeless tobacco: Never Used  Substance and Sexual Activity  . Alcohol use: Yes    Alcohol/week: 2.4 - 3.0 oz    Types: 4 -  5 Standard drinks or equivalent per week  . Drug use: No  . Sexual activity: Not on file  Lifestyle  . Physical activity:    Days per week: Not on file    Minutes per session: Not on file  . Stress: Not on file  Relationships  . Social connections:    Talks on phone: Not on file    Gets together: Not on file    Attends religious service: Not on file    Active member of club or organization: Not on file    Attends meetings of clubs or organizations: Not on file    Relationship status: Not on file  . Intimate partner violence:    Fear of current or ex partner: Not on file    Emotionally abused: Not on file    Physically abused: Not on file    Forced sexual activity:  Not on file  Other Topics Concern  . Not on file  Social History Narrative  . Not on file    Family History  Problem Relation Age of Onset  . Hyperlipidemia Mother   . Diabetes Father   . Heart disease Father   . Hyperlipidemia Father   . Cancer Maternal Grandmother        BREAST AND UTERINE  . Cancer Maternal Grandfather        LUNG  . Cancer Paternal Grandmother        LUNG  . Cancer Paternal Grandfather        LUNG     Review of Systems  Constitutional: Negative.  Negative for chills and fever.  HENT: Negative for sore throat.   Respiratory: Negative for shortness of breath.   Gastrointestinal: Negative for nausea and vomiting.  Skin:       Burn  All other systems reviewed and are negative.   Vitals:   02/11/18 1039  BP: 116/82  Pulse: 100  Resp: 16  Temp: 98.2 F (36.8 C)  SpO2: 98%    Physical Exam  Constitutional: She is oriented to person, place, and time. She appears well-developed.  HENT:  Head: Normocephalic and atraumatic.  Eyes: Pupils are equal, round, and reactive to light. EOM are normal.  Neck: Normal range of motion.  Cardiovascular: Normal rate.  Pulmonary/Chest: Effort normal.  Neurological: She is alert and oriented to person, place, and time.  Skin: Skin is warm and dry.  Psychiatric: She has a normal mood and affect. Her behavior is normal.  Vitals reviewed.   .    ASSESSMENT & PLAN: Carol Zavala was seen today for follow-up.  Diagnoses and all orders for this visit:  Partial thickness burn of left thigh, subsequent encounter Comments: improving    Patient Instructions       IF you received an x-ray today, you will receive an invoice from [facility]. Please contact [facility] at [phone] with questions or concerns regarding your invoice.   IF you received labwork today, you will receive an invoice from [facility]. Please contact [facility] at [phone] with questions or concerns regarding your  invoice.   Our billing staff will not be able to assist you with questions regarding bills from these companies.  You will be contacted with the lab results as soon as they are available. The fastest way to get your results is to activate your My Chart account. Instructions are located on the last page of this paperwork. If you have not heard from [place] regarding the results in 2 weeks, please contact this  office.     Burn Care, Adult A burn is an injury to the skin or the tissues under the skin. There are three types of burns:  First degree. These burns may cause the skin to be red and a bit swollen.  Second degree. These burns are very painful and cause the skin to be very red. The skin may also leak fluid, look shiny, and start to have blisters.  Third degree. These burns cause permanent damage. They turn the skin white or black and make it look charred, dry, and leathery.  Taking care of your burn properly can help to prevent pain and infection. It can also help the burn to heal more quickly. How is this treated? Right after a burn:  Rinse or soak the burn under cool water. Do this for several minutes. Do not put ice on your burn. That can cause more damage.  Lightly cover the burn with a clean (sterile) cloth (dressing). Burn care  Raise (elevate) the injured area above the level of your heart while sitting or lying down.  Follow instructions from your doctor about: ? How to clean and take care of the burn. ? When to change and remove the cloth.  Check your burn every day for signs of infection. Check for: ? More redness, swelling, or pain. ? Warmth. ? Pus or a bad smell. Medicine   Take over-the-counter and prescription medicines only as told by your doctor.  If you were prescribed antibiotic medicine, take or apply it as told by your doctor. Do not stop using the antibiotic even if your condition improves. General instructions  To prevent infection: ? Do not put  butter, oil, or other home treatments on the burn. ? Do not scratch or pick at the burn. ? Do not break any blisters. ? Do not peel skin.  Do not rub your burn, even when you are cleaning it.  Protect your burn from the sun. Contact a doctor if:  Your condition does not get better.  Your condition gets worse.  You have a fever.  Your burn looks different or starts to have black or red spots on it.  Your burn feels warm to the touch.  Your pain is not controlled with medicine. Get help right away if:  You have redness, swelling, or pain at the site of the burn.  You have fluid, blood, or pus coming from your burn.  You have red streaks near the burn.  You have very bad pain. This information is not intended to replace advice given to you by your health care provider. Make sure you discuss any questions you have with your health care provider. Document Released: 06/30/2008 Document Revised: 11/07/2016 Document Reviewed: 03/10/2016 Elsevier Interactive Patient Education  2018 Elsevier Inc.      Edwina Barth, MD [facility]

## 2018-02-11 NOTE — Patient Instructions (Addendum)
   IF you received an x-ray today, you will receive an invoice from [facility]. Please contact [facility] at [phone] with questions or concerns regarding your invoice.   IF you received labwork today, you will receive an invoice from [facility]. Please contact [facility] at [phone] with questions or concerns regarding your invoice.   Our billing staff will not be able to assist you with questions regarding bills from these companies.  You will be contacted with the lab results as soon as they are available. The fastest way to get your results is to activate your My Chart account. Instructions are located on the last page of this paperwork. If you have not heard from us regarding the results in 2 weeks, please contact this office.     Burn Care, Adult A burn is an injury to the skin or the tissues under the skin. There are three types of burns:  First degree. These burns may cause the skin to be red and a bit swollen.  Second degree. These burns are very painful and cause the skin to be very red. The skin may also leak fluid, look shiny, and start to have blisters.  Third degree. These burns cause permanent damage. They turn the skin white or black and make it look charred, dry, and leathery.  Taking care of your burn properly can help to prevent pain and infection. It can also help the burn to heal more quickly. How is this treated? Right after a burn:  Rinse or soak the burn under cool water. Do this for several minutes. Do not put ice on your burn. That can cause more damage.  Lightly cover the burn with a clean (sterile) cloth (dressing). Burn care  Raise (elevate) the injured area above the level of your heart while sitting or lying down.  Follow instructions from your doctor about: ? How to clean and take care of the burn. ? When to change and remove the cloth.  Check your burn every day for signs of infection. Check for: ? More redness,  swelling, or pain. ? Warmth. ? Pus or a bad smell. Medicine   Take over-the-counter and prescription medicines only as told by your doctor.  If you were prescribed antibiotic medicine, take or apply it as told by your doctor. Do not stop using the antibiotic even if your condition improves. General instructions  To prevent infection: ? Do not put butter, oil, or other home treatments on the burn. ? Do not scratch or pick at the burn. ? Do not break any blisters. ? Do not peel skin.  Do not rub your burn, even when you are cleaning it.  Protect your burn from the sun. Contact a doctor if:  Your condition does not get better.  Your condition gets worse.  You have a fever.  Your burn looks different or starts to have black or red spots on it.  Your burn feels warm to the touch.  Your pain is not controlled with medicine. Get help right away if:  You have redness, swelling, or pain at the site of the burn.  You have fluid, blood, or pus coming from your burn.  You have red streaks near the burn.  You have very bad pain. This information is not intended to replace advice given to you by your health care provider. Make sure you discuss any questions you have with your health care provider. Document Released: 06/30/2008 Document Revised: 11/07/2016 Document Reviewed: 03/10/2016 Elsevier Interactive   Patient Education  2018 Elsevier Inc.  

## 2018-02-22 ENCOUNTER — Other Ambulatory Visit: Payer: Self-pay

## 2018-02-22 ENCOUNTER — Encounter: Payer: Self-pay | Admitting: Emergency Medicine

## 2018-02-22 ENCOUNTER — Ambulatory Visit: Payer: BLUE CROSS/BLUE SHIELD | Admitting: Emergency Medicine

## 2018-02-22 VITALS — BP 110/66 | HR 111 | Temp 98.2°F | Resp 16

## 2018-02-22 DIAGNOSIS — T24212D Burn of second degree of left thigh, subsequent encounter: Secondary | ICD-10-CM | POA: Diagnosis not present

## 2018-02-22 MED ORDER — MUPIROCIN 2 % EX OINT: TOPICAL_OINTMENT | CUTANEOUS | 1 refills | Status: DC

## 2018-02-22 NOTE — Progress Notes (Signed)
Carol Zavala 37 y.o.   Chief Complaint  Patient presents with  . Burn    LEFT UPPER THIGH follow up    HISTORY OF PRESENT ILLNESS: This is a 37 y.o. female here for recheck of burn to the left upper thigh.  Doing better. HPI   Prior to Admission medications   Medication Sig Start Date End Date Taking? Authorizing Provider  albuterol (PROVENTIL) (2.5 MG/3ML) 0.083% nebulizer solution Take 3 mLs (2.5 mg total) by nebulization every 6 (six) hours as needed for wheezing or shortness of breath. 11/13/15  Yes English, Judeth Cornfield D, PA  MELATONIN PO Take by mouth at bedtime.   Yes [provider]  doxycycline (VIBRA-TABS) 100 MG tablet Take 1 tablet (100 mg total) by mouth 2 (two) times daily. Patient not taking: Reported on 02/22/2018 02/08/18   Georgina Quint, MD  mupirocin ointment [facility]) 2 % Sig to affected area twice a day x 7 days. 02/22/18   Georgina Quint, MD  ranitidine (ZANTAC) 150 MG tablet Take 1 tablet (150 mg total) by mouth 2 (two) times daily. Patient not taking: Reported on 02/11/2018 07/08/14   Gwyneth Sprout, MD  zonisamide (ZONEGRAN) 100 MG capsule TAKE TWO CAPSULES BY MOUTH AT BEDTIME Patient not taking: Reported on 02/08/2018 12/20/17   Van Clines, MD    Allergies  Allergen Reactions  . Latex Anaphylaxis  . Neomycin Anaphylaxis  . Amoxicillin Diarrhea  . Azithromycin Diarrhea  . Ciprofloxacin Other (See Comments)  . Doxycycline Itching  . Keflex [Cephalexin] Diarrhea  . Suprax [Cefixime] Other (See Comments)  . Septra [Sulfamethoxazole-Trimethoprim] Rash    Patient Active Problem List   Diagnosis Date Noted  . Second degree burn of left thigh 02/02/2018  . Acute UTI 05/27/2017  . Abnormal urine odor 05/27/2017  . General weakness 05/27/2017  . 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 Seizures (HCC)  02/20/2014  . Hydrocephalus 02/20/2014  . Spina bifida (HCC) 02/20/2014    Past Medical History:  Diagnosis Date  . Anxiety   . Asthma   . Hydrocephalus, congenital (HCC)   . Spina bifida [facility])     Past Surgical History:  Procedure Laterality Date  . ABDOMINAL HYSTERECTOMY    . APPENDECTOMY    . EYE SURGERY    . SPINE SURGERY    . TONSILLECTOMY AND ADENOIDECTOMY    . 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 Marital status: Single    Spouse name: Not on file  . Number of children: Not on file  . Years of education: Not on file  . Highest education level: Not on file  Occupational History  . Not on file  Social Needs  . Financial resource strain: Not on file  . Food insecurity:    Worry: Not on file    Inability: Not on file  . Transportation needs:    Medical: Not on file    Non-medical: Not on file  Tobacco Use  . Smoking status: Never Smoker  . Smokeless tobacco: Never Used  Substance and Sexual Activity  . Alcohol use: Yes    Alcohol/week: 2.4 - 3.0 oz    Types: 4 - 5 Standard drinks or equivalent per week  . Drug use: No  . Sexual activity: Not on file  Lifestyle  . Physical activity:    Days  per week: Not on file    Minutes per session: Not on file  . Stress: Not on file  Relationships  . Social connections:    Talks on phone: Not on file    Gets together: Not on file    Attends religious service: Not on file    Active member of club or organization: Not on file    Attends meetings of clubs or organizations: Not on file    Relationship status: Not on file  . Intimate partner violence:    Fear of current or ex partner: Not on file    Emotionally abused: Not on file    Physically abused: Not on file    Forced sexual activity: Not on file  Other Topics Concern  . Not on file  Social History Narrative  . Not on file    Family History  Problem Relation Age of  Onset  . Hyperlipidemia Mother   . Diabetes Father   . Heart disease Father   . Hyperlipidemia Father   . Cancer Maternal Grandmother        BREAST AND UTERINE  . Cancer Maternal Grandfather        LUNG  . Cancer Paternal Grandmother        LUNG  . Cancer Paternal Grandfather        LUNG     Review of Systems  Constitutional: Negative.  Negative for chills and fever.  Respiratory: Negative for shortness of breath.   Cardiovascular: Negative for chest pain.  Gastrointestinal: Negative for nausea and vomiting.  Skin: Negative for rash.    Vitals:   02/22/18 1634  BP: 110/66  Pulse: (!) 111  Resp: 16  Temp: 98.2 F (36.8 C)  SpO2: 98%    Physical Exam  Constitutional: She is oriented to person, place, and time. She appears well-developed and well-nourished.  HENT:  Head: Normocephalic.  Neck: Normal range of motion.  Cardiovascular: Normal rate.  Pulmonary/Chest: Effort normal.  Neurological: She is alert and oriented to person, place, and time.  Skin: Capillary refill takes less than 2 seconds.  Much improved second-degree burn to left upper thigh.  No signs of infection.  Psychiatric: She has a normal mood and affect. Her behavior is normal.  Vitals reviewed.    ASSESSMENT & PLAN: Carol Zavala was seen today for burn.  Diagnoses and all orders for this visit:  Partial thickness burn of left thigh, subsequent encounter -     mupirocin ointment (BACTROBAN) 2 %; Sig to affected area twice a day x 7 days.    Patient Instructions       IF you received an x-ray today, you will receive an invoice from [facility]. Please contact [facility] at [phone] with questions or concerns regarding your invoice.   IF you received labwork today, you will receive an invoice from [facility]. Please contact [facility] at [phone] with questions or concerns regarding your invoice.   Our billing staff will not be able to assist you with questions  regarding bills from these companies.  You will be contacted with the lab results as soon as they are available. The fastest way to get your results is to activate your My Chart account. Instructions are located on the last page of this paperwork. If you have not heard from [place] regarding the results in 2 weeks, please contact this office.     Burn Care, Adult A burn is an injury to the skin or the tissues under the skin.  There are three types of burns:  First degree. These burns may cause the skin to be red and a bit swollen.  Second degree. These burns are very painful and cause the skin to be very red. The skin may also leak fluid, look shiny, and start to have blisters.  Third degree. These burns cause permanent damage. They turn the skin white or black and make it look charred, dry, and leathery.  Taking care of your burn properly can help to prevent pain and infection. It can also help the burn to heal more quickly. How is this treated? Right after a burn:  Rinse or soak the burn under cool water. Do this for several minutes. Do not put ice on your burn. That can cause more damage.  Lightly cover the burn with a clean (sterile) cloth (dressing). Burn care  Raise (elevate) the injured area above the level of your heart while sitting or lying down.  Follow instructions from your doctor about: ? How to clean and take care of the burn. ? When to change and remove the cloth.  Check your burn every day for signs of infection. Check for: ? More redness, swelling, or pain. ? Warmth. ? Pus or a bad smell. Medicine   Take over-the-counter and prescription medicines only as told by your doctor.  If you were prescribed antibiotic medicine, take or apply it as told by your doctor. Do not stop using the antibiotic even if your condition improves. General instructions  To prevent infection: ? Do not put butter, oil, or other home treatments on the burn. ? Do not scratch or pick at  the burn. ? Do not break any blisters. ? Do not peel skin.  Do not rub your burn, even when you are cleaning it.  Protect your burn from the sun. Contact a doctor if:  Your condition does not get better.  Your condition gets worse.  You have a fever.  Your burn looks different or starts to have black or red spots on it.  Your burn feels warm to the touch.  Your pain is not controlled with medicine. Get help right away if:  You have redness, swelling, or pain at the site of the burn.  You have fluid, blood, or pus coming from your burn.  You have red streaks near the burn.  You have very bad pain. This information is not intended to replace advice given to you by your health care provider. Make sure you discuss any questions you have with your health care provider. Document Released: 06/30/2008 Document Revised: 11/07/2016 Document Reviewed: 03/10/2016 Elsevier Interactive Patient Education  2018 Elsevier Inc.      Edwina Barth, MD [facility]

## 2018-02-22 NOTE — Patient Instructions (Addendum)
   IF you received an x-ray today, you will receive an invoice from [facility]. Please contact [facility] at [phone] with questions or concerns regarding your invoice.   IF you received labwork today, you will receive an invoice from [facility]. Please contact [facility] at [phone] with questions or concerns regarding your invoice.   Our billing staff will not be able to assist you with questions regarding bills from these companies.  You will be contacted with the lab results as soon as they are available. The fastest way to get your results is to activate your My Chart account. Instructions are located on the last page of this paperwork. If you have not heard from us regarding the results in 2 weeks, please contact this office.     Burn Care, Adult A burn is an injury to the skin or the tissues under the skin. There are three types of burns:  First degree. These burns may cause the skin to be red and a bit swollen.  Second degree. These burns are very painful and cause the skin to be very red. The skin may also leak fluid, look shiny, and start to have blisters.  Third degree. These burns cause permanent damage. They turn the skin white or black and make it look charred, dry, and leathery.  Taking care of your burn properly can help to prevent pain and infection. It can also help the burn to heal more quickly. How is this treated? Right after a burn:  Rinse or soak the burn under cool water. Do this for several minutes. Do not put ice on your burn. That can cause more damage.  Lightly cover the burn with a clean (sterile) cloth (dressing). Burn care  Raise (elevate) the injured area above the level of your heart while sitting or lying down.  Follow instructions from your doctor about: ? How to clean and take care of the burn. ? When to change and remove the cloth.  Check your burn every day for signs of infection. Check for: ? More redness,  swelling, or pain. ? Warmth. ? Pus or a bad smell. Medicine   Take over-the-counter and prescription medicines only as told by your doctor.  If you were prescribed antibiotic medicine, take or apply it as told by your doctor. Do not stop using the antibiotic even if your condition improves. General instructions  To prevent infection: ? Do not put butter, oil, or other home treatments on the burn. ? Do not scratch or pick at the burn. ? Do not break any blisters. ? Do not peel skin.  Do not rub your burn, even when you are cleaning it.  Protect your burn from the sun. Contact a doctor if:  Your condition does not get better.  Your condition gets worse.  You have a fever.  Your burn looks different or starts to have black or red spots on it.  Your burn feels warm to the touch.  Your pain is not controlled with medicine. Get help right away if:  You have redness, swelling, or pain at the site of the burn.  You have fluid, blood, or pus coming from your burn.  You have red streaks near the burn.  You have very bad pain. This information is not intended to replace advice given to you by your health care provider. Make sure you discuss any questions you have with your health care provider. Document Released: 06/30/2008 Document Revised: 11/07/2016 Document Reviewed: 03/10/2016 Elsevier Interactive   Patient Education  2018 Elsevier Inc.  

## 2018-02-24 ENCOUNTER — Telehealth: Payer: Self-pay | Admitting: Emergency Medicine

## 2018-02-24 ENCOUNTER — Encounter: Payer: Self-pay | Admitting: Family Medicine

## 2018-02-24 NOTE — Telephone Encounter (Signed)
Patient needs FMLA forms completed by Dr Alvy Bimler based off her last OV for a 2nd degree burn on her left thigh area. I have completed what I could from the OV notes and highlighted the areas I was not sure about. I did not see that she was taken out work for anytime so I was not sure about that section of the form. I will place the forms in Dr Latrelle Dodrill box on 02/24/18 please return to the FMLA/Disabiliy box at the 102 checkout desk within 5-7 business days. Thank you!

## 2018-02-25 NOTE — Telephone Encounter (Signed)
Form completed. Thanks for your help.

## 2018-03-01 NOTE — Telephone Encounter (Signed)
Paperwork scanned and faxed on 03/01/18

## 2018-03-08 ENCOUNTER — Encounter: Payer: Self-pay | Admitting: Emergency Medicine

## 2018-03-08 NOTE — Telephone Encounter (Signed)
Gynecologist first. Thanks.

## 2018-03-30 ENCOUNTER — Encounter (HOSPITAL_COMMUNITY): Payer: Self-pay | Admitting: *Deleted

## 2018-04-12 ENCOUNTER — Encounter (HOSPITAL_COMMUNITY): Payer: Self-pay | Admitting: Emergency Medicine

## 2018-04-12 ENCOUNTER — Emergency Department (HOSPITAL_COMMUNITY)
Admission: EM | Admit: 2018-04-12 | Discharge: 2018-04-12 | Disposition: A | Discharge status: Home / Self Care | Payer: BLUE CROSS/BLUE SHIELD | Attending: Emergency Medicine | Admitting: Emergency Medicine

## 2018-04-12 DIAGNOSIS — J45909 Unspecified asthma, uncomplicated: Secondary | ICD-10-CM | POA: Insufficient documentation

## 2018-04-12 DIAGNOSIS — Z79899 Other long term (current) drug therapy: Secondary | ICD-10-CM | POA: Insufficient documentation

## 2018-04-12 DIAGNOSIS — Z91040 Latex allergy status: Secondary | ICD-10-CM | POA: Insufficient documentation

## 2018-04-12 DIAGNOSIS — R000 Tachycardia, unspecified: Secondary | ICD-10-CM | POA: Insufficient documentation

## 2018-04-12 DIAGNOSIS — R21 Rash and other nonspecific skin eruption: Secondary | ICD-10-CM

## 2018-04-12 MED ORDER — PREDNISONE 20 MG PO TABS: 40.0000 mg | ORAL_TABLET | Freq: Every day | ORAL | 0 refills | Status: DC

## 2018-04-12 MED ORDER — PREDNISONE 20 MG PO TABS
60.0000 mg | ORAL_TABLET | Freq: Once | ORAL | Status: AC
  Administered 2018-04-12: 60 mg via ORAL
  Filled 2018-04-12: qty 3

## 2018-04-12 MED ORDER — TRIAMCINOLONE ACETONIDE 0.1 % EX CREA: 1.0000 "application " | TOPICAL_CREAM | Freq: Two times a day (BID) | CUTANEOUS | 0 refills | Status: DC

## 2018-04-12 NOTE — Discharge Instructions (Signed)
Please take prednisone for the next 4 days Take Benadryl as needed for itching Use Triamcinolone cream on affected areas until rash resolves Please follow up with your doctor

## 2018-04-12 NOTE — ED Provider Notes (Signed)
[facility]-EMERGENCY DEPT Provider Note   CSN: [id number] Arrival date & time: 04/12/18  2018     History   Chief Complaint Chief Complaint  Patient presents with  . Rash    HPI Carol Zavala is a 37 y.o. female who presents with a rash.  Past medical history significant for spina bifida, seizures, congenital hydrocephalus, multiple allergies.  The patient states that she first noticed several spots on her arms today while she was at work and was typing.  She initially thought it was a mosquito bite.  Since then the rash has spread to bilateral arms behind her neck, back, abdomen and legs.  The rash is very itchy.  She is been taking Benadryl without significant relief.  She denies drooling, shortness of breath, vomiting.  She does feel like it was a little bit more difficult to swallow when eating dinner tonight.  She denies any new lotions, detergents, soaps.  She did wear a new bra yesterday and had some irritation at that time.  HPI  Past Medical History:  Diagnosis Date  . Anxiety   . Asthma   . Hydrocephalus, congenital (HCC)   . Seizures (HCC)   . Spina bifida [facility](HCC)     Patient Active Problem List   Diagnosis Date Noted  . Second degree burn of left thigh 02/02/2018  . Acute UTI 05/27/2017  . Abnormal urine odor 05/27/2017  . General weakness 05/27/2017  . 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 Seizures (HCC) 02/20/2014  . Hydrocephalus 02/20/2014  . Spina bifida (HCC) 02/20/2014    Past Surgical History:  Procedure Laterality Date  . ABDOMINAL HYSTERECTOMY    . APPENDECTOMY    . EYE SURGERY    . SPINE SURGERY    . TONSILLECTOMY AND ADENOIDECTOMY    . 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 Authorizing Provider  albuterol (PROVENTIL) (2.5 MG/3ML) 0.083% nebulizer solution Take 3 mLs (2.5 mg total) by nebulization every 6 (six) hours as needed for wheezing or shortness of breath. 11/13/15   Trena PlattEnglish, Stephanie D, PA  doxycycline (VIBRA-TABS) 100 MG tablet Take 1 tablet (100 mg total) by mouth 2 (two) times daily. Patient not taking: Reported on 02/22/2018 02/08/18   Georgina QuintSagardia, Miguel Jose, MD  MELATONIN PO Take by mouth at bedtime.    [provider]  mupirocin ointment (BACTROBAN) 2 % Sig to affected area twice a day x 7 days. 02/22/18   Georgina QuintSagardia, Miguel Jose, MD  ranitidine (ZANTAC) 150 MG tablet Take 1 tablet (150 mg total) by mouth 2 (two) times daily. Patient not taking: Reported on 02/11/2018 07/08/14   Gwyneth SproutPlunkett, Whitney, MD  zonisamide (ZONEGRAN) 100 MG capsule TAKE TWO CAPSULES BY MOUTH AT BEDTIME Patient not taking: Reported on 02/08/2018 12/20/17   Van ClinesAquino, Karen M, MD    Family History Family History  Problem Relation Age of Onset  . Hyperlipidemia Mother   . Diabetes Father   . Heart disease Father   . Hyperlipidemia Father   . Cancer Maternal Grandmother        BREAST AND UTERINE  . Cancer Maternal Grandfather        LUNG  . Cancer Paternal Grandmother  LUNG  . Cancer Paternal Grandfather        LUNG    Social History Social History   Tobacco Use  . Smoking status: Never Smoker  . Smokeless tobacco: Never Used  Substance Use Topics  . Alcohol use: Yes    Alcohol/week: 2.4 - 3.0 oz    Types: 4 - 5 Standard drinks or equivalent per week  . Drug use: No     Allergies   Latex; Neomycin; Amoxicillin; Azithromycin; Ciprofloxacin; Doxycycline; Keflex [cephalexin]; Septra [sulfamethoxazole-trimethoprim]; and Suprax [cefixime]   Review of Systems Review of Systems  Constitutional: Negative for fever.  Skin: Positive for rash.     Physical Exam Updated Vital Signs BP (!) 139/98 (BP Location: Left  Arm)   Pulse (!) 122   Temp 98 F (36.7 C) (Oral)   Resp 20   Ht 4\' 9"  (1.448 m)   Wt 61.7 kg (136 lb)   SpO2 100%   BMI 29.43 kg/m   Physical Exam  Constitutional: She is oriented to person, place, and time. She appears well-developed and well-nourished. No distress.  Calm and cooperative.  Obese  HENT:  Head: Normocephalic and atraumatic.  Eyes: Pupils are equal, round, and reactive to light. Conjunctivae are normal. Right eye exhibits no discharge. Left eye exhibits no discharge. No scleral icterus.  Neck: Normal range of motion.  Cardiovascular: Tachycardia present.  Pulmonary/Chest: Effort normal and breath sounds normal. No respiratory distress.  Abdominal: She exhibits no distension.  Neurological: She is alert and oriented to person, place, and time.  Skin: Skin is warm and dry. Rash noted.  Generalized erythematous rash which looks like mosquito bites and some areas on the arms, abdomen and back, and back of the neck.  There is a long raised linear area at the bra line and at the waistline of the pants.  No vesicular lesions  Psychiatric: She has a normal mood and affect. Her behavior is normal.  Nursing note and vitals reviewed.    ED Treatments / Results  Labs (all labs ordered are listed, but only abnormal results are displayed) Labs Reviewed - No data to display  EKG None  Radiology No results found.  Procedures Procedures (including critical care time)  Medications Ordered in ED Medications  predniSONE (DELTASONE) tablet 60 mg (60 mg Oral Given 04/12/18 2103)     Initial Impression / Assessment and Plan / ED Course  I have reviewed the triage vital signs and the nursing notes.  Pertinent labs & imaging results that were available during my care of the patient were reviewed by me and considered in my medical decision making (see chart for details).  37 year old female with generalized rash starting earlier today.  Unclear etiology but suspect contact  dermatitis.  She has multiple allergies reported and her rash is in a linear distribution around her bra and waist line.  No signs of anaphylaxis.  She is tachycardic which is a chronic issue for her.  On recheck it is improved. She is taking Benadryl without relief.  We will add oral steroids and a steroid cream for home. She was advised to f/u with her doctor and return if worsening.  Final Clinical Impressions(s) / ED Diagnoses   Final diagnoses:  Rash and nonspecific skin eruption    ED Discharge Orders    None       Beryle Quant 04/12/18 2139    Lorre Nick, MD 04/13/18 1415

## 2018-04-12 NOTE — H&P (Signed)
NAME: Carol Zavala, Carol P. MEDICAL RECORD [id number] ACCOUNT [id number] DATE OF BIRTH:March 26, 1981 FACILITY: [facility] LOCATION:  PHYSICIAN:Kimm Ungaro Lisbeth PlyS. Nathasha Fiorillo, MD  HISTORY AND PHYSICAL  DATE OF ADMISSION:  04/28/2018  DATE OF SURGERY:  07/25 at [facility] [place] in [place].   HISTORY OF PRESENT ILLNESS:  The patient is a 37 year old nulligravid female with issues with spina bifida.  She is paralyzed from the waist down.  She has had a previous vaginal hysterectomy in 2010 for pelvic relaxation.  She felt something bulging  that was causing difficulty with self-catheterization.  Exam in the office revealed a significant rectocele.  After discussion of options, the patient now presents for anterior and posterior repair and possible sacrospinous ligament suspension.  ALLERGIES:  SHE IS ALLERGIC TO LATEX, NEOMYCIN, SEPTRA, SUPRAX AND CIPRO.  HOME MEDICATIONS:  She is on melatonin.  PAST MEDICAL HISTORY:  She has a history of spina bifida.  PAST SURGICAL HISTORY:  He has had an appendectomy.  She has had shunt revisions numerous back surgeries, had a hysterectomy and had foot surgery.  SOCIAL HISTORY SOCIAL HISTORY:  No tobacco and occasional alcohol use.  FAMILY HISTORY:  Noncontributory.  REVIEW OF SYSTEMS:  Noncontributory.  PHYSICAL EXAMINATION: VITAL SIGNS:  Afebrile, stable vital signs. HEENT:  The patient is normocephalic.  Pupils equal, round, reactive to light and accommodation.  Extraocular movements are intact.  Sclerae and conjunctiva clear.  Oropharynx clear. NECK:  Without thyromegaly. BREASTS:  Not examined. LUNGS:  Clear. HEART:  Regular rate and rhythm.  No murmurs or gallops. ABDOMEN:  Benign.  No masses, organomegaly or tenderness.   PELVIC:  Prominent rectocele.  Cuff intact.  Minimal cystocele.  Bimanual exam is unremarkable. EXTREMITIES:  Trace edema. NEUROLOGIC:  Grossly within normal limits except for being paralyzed from the waist down.  ASSESSMENT:   1.   Symptomatic pelvic relaxation. 2.  History of spina bifida with resultant paralysis.  PLAN:  The patient to undergo the above-noted surgery.  The risks and benefits discussed including the risk of infection.  The risk of hemorrhage that could require transfusion with the risk of AIDS or hepatitis.  Risk of injury to adjacent organs could  require further exploratory surgery.  Risk of deep venous thrombosis and pulmonary embolus.  Also, potential recurrence of pelvic relaxation has been discussed.  The patient understands the potential indications and risk and is accepting of them.  Other  alternatives were discussed including pessary.  TN/NUANCE  D:04/12/2018 T:04/12/2018 JOB:[id number]

## 2018-04-12 NOTE — H&P (Signed)
Patient name Carol Zavala, Tineshia DICTATION# [id number] CSN# [id number]  [facility] Robel S, MD 04/12/2018 7:00 AM

## 2018-04-12 NOTE — ED Triage Notes (Signed)
Patient c/o itching rash to generalized body today. Denies new sprays, creams, deodorants, lotions. Denies SOB and oral swelling. Speaking in full sentences.

## 2018-04-13 NOTE — Patient Instructions (Addendum)
Your procedure is scheduled on: Thursday April 28, 2018 at 7:30 am  Enter through the Main Entrance of [facility] at: 6:00 am   Pick up the phone at the desk and dial [phone].  Call this number if you have problems the morning of surgery: [phone].  Remember: Do not eat food or dink any liquids after: Midnight on Wednesday July 24  Do NOT drink clear liquids after: Take these medicines the morning of surgery with a SIP OF WATER: Benadryl if needed  Bring albuterol inhaler with you day of surgery  STOP ALL VITAMINS, SUPPLEMENTS, HERBAL MEDICATIONS NOW  DO NOT SMOKE DAY OF SURGERY  BRUSH YOUR TEETH DAY OF SURGERY  Do NOT wear jewelry (body piercing), metal hair clips/bobby pins, make-up, or nail polish. Do NOT wear lotions, powders, or perfumes.  You may wear deoderant. Do NOT shave for 48 hours prior to surgery. Do NOT bring valuables to the hospital. Contacts, dentures, or bridgework may not be worn into surgery. Leave suitcase in car.  After surgery it may be brought to your room.    For patients admitted to the hospital, checkout time is 11:00 AM the day of discharge. Have a responsible adult drive you home and stay with you for 24 hours after your procedure

## 2018-04-20 ENCOUNTER — Other Ambulatory Visit: Payer: Self-pay

## 2018-04-20 ENCOUNTER — Encounter (HOSPITAL_COMMUNITY): Payer: Self-pay

## 2018-04-20 ENCOUNTER — Encounter (HOSPITAL_COMMUNITY)
Admission: RE | Admit: 2018-04-20 | Discharge: 2018-04-20 | Disposition: A | Discharge status: Home / Self Care | Payer: BLUE CROSS/BLUE SHIELD | Source: Ambulatory Visit | Attending: Obstetrics and Gynecology | Admitting: Obstetrics and Gynecology

## 2018-04-20 DIAGNOSIS — Z01812 Encounter for preprocedural laboratory examination: Secondary | ICD-10-CM | POA: Insufficient documentation

## 2018-04-20 HISTORY — DX: Peripheral vascular disease, unspecified: I73.9

## 2018-04-20 HISTORY — DX: Headache, unspecified: R51.9

## 2018-04-20 HISTORY — DX: Headache: R51

## 2018-04-20 HISTORY — DX: Cystitis, unspecified without hematuria: N30.90

## 2018-04-20 HISTORY — DX: Myoneural disorder, unspecified: G70.9

## 2018-04-20 HISTORY — DX: Gastro-esophageal reflux disease without esophagitis: K21.9

## 2018-04-20 HISTORY — DX: Chronic kidney disease, unspecified: N18.9

## 2018-04-20 LAB — CBC
HCT: 44.8 % (ref 36.0–46.0)
HEMOGLOBIN: 14.9 g/dL (ref 12.0–15.0)
MCH: 28.9 pg (ref 26.0–34.0)
MCHC: 33.3 g/dL (ref 30.0–36.0)
MCV: 87 fL (ref 78.0–100.0)
PLATELETS: 317 10*3/uL (ref 150–400)
RBC: 5.15 MIL/uL — AB (ref 3.87–5.11)
RDW: 13.3 % (ref 11.5–15.5)
WBC: 11.6 10*3/uL — ABNORMAL HIGH (ref 4.0–10.5)

## 2018-04-20 LAB — BASIC METABOLIC PANEL
ANION GAP: 12 (ref 5–15)
BUN: 9 mg/dL (ref 6–20)
CALCIUM: 8.7 mg/dL — AB (ref 8.9–10.3)
CO2: 23 mmol/L (ref 22–32)
Chloride: 102 mmol/L (ref 98–111)
Creatinine, Ser: 0.43 mg/dL — ABNORMAL LOW (ref 0.44–1.00)
GLUCOSE: 112 mg/dL — AB (ref 70–99)
Potassium: 3.5 mmol/L (ref 3.5–5.1)
Sodium: 137 mmol/L (ref 135–145)

## 2018-04-20 LAB — TYPE AND SCREEN
ABO/RH(D): B POS
ANTIBODY SCREEN: NEGATIVE

## 2018-04-27 NOTE — Anesthesia Preprocedure Evaluation (Addendum)
Anesthesia Evaluation  Patient identified by MRN, date of birth, ID band Patient awake    Reviewed: Allergy & Precautions, NPO status , Patient's Chart, lab work & pertinent test results  Airway Mallampati: III  TM Distance: >3 FB Neck ROM: Full  Mouth opening: Limited Mouth Opening  Dental no notable dental hx. (+) Teeth Intact, Dental Advisory Given   Pulmonary asthma ,    Pulmonary exam normal breath sounds clear to auscultation       Cardiovascular Normal cardiovascular exam Rhythm:Regular Rate:Normal     Neuro/Psych Anxiety Pt with Hx of spina Bifida and is paralyzed from the waist down   Neuromuscular disease    GI/Hepatic GERD  ,  Endo/Other  Morbid obesity  Renal/GU      Musculoskeletal   Abdominal (+) + obese,   Peds  Hematology   Anesthesia Other Findings   Reproductive/Obstetrics negative OB ROS                            Lab Results  Component Value Date   WBC 11.6 (H) 04/20/2018   HGB 14.9 04/20/2018   HCT 44.8 04/20/2018   MCV 87.0 04/20/2018   PLT 317 04/20/2018   Lab Results  Component Value Date   CREATININE 0.43 (L) 04/20/2018   BUN 9 04/20/2018   NA 137 04/20/2018   K 3.5 04/20/2018   CL 102 04/20/2018   CO2 23 04/20/2018    Anesthesia Physical Anesthesia Plan  ASA: III  Anesthesia Plan: General   Post-op Pain Management:    Induction: Intravenous  PONV Risk Score and Plan: Treatment may vary due to age or medical condition, Dexamethasone, Ondansetron and Scopolamine patch - Pre-op  Airway Management Planned: Oral ETT and Video Laryngoscope Planned  Additional Equipment:   Intra-op Plan:   Post-operative Plan: Extubation in OR  Informed Consent: I have reviewed the patients History and Physical, chart, labs and discussed the procedure including the risks, benefits and alternatives for the proposed anesthesia with the patient or authorized  representative who has indicated his/her understanding and acceptance.   Dental advisory given  Plan Discussed with: CRNA  Anesthesia Plan Comments:        Anesthesia Quick Evaluation

## 2018-04-28 ENCOUNTER — Ambulatory Visit (HOSPITAL_COMMUNITY): Payer: BLUE CROSS/BLUE SHIELD | Admitting: Anesthesiology

## 2018-04-28 ENCOUNTER — Other Ambulatory Visit: Payer: Self-pay

## 2018-04-28 ENCOUNTER — Observation Stay (HOSPITAL_COMMUNITY)
Admission: RE | Admit: 2018-04-28 | Discharge: 2018-04-29 | Disposition: A | Discharge status: Home / Self Care | Payer: BLUE CROSS/BLUE SHIELD | Source: Ambulatory Visit | Attending: Obstetrics and Gynecology | Admitting: Obstetrics and Gynecology

## 2018-04-28 ENCOUNTER — Encounter (HOSPITAL_COMMUNITY): Payer: Self-pay | Admitting: *Deleted

## 2018-04-28 ENCOUNTER — Encounter (HOSPITAL_COMMUNITY)
Admission: RE | Disposition: A | Discharge status: Home / Self Care | Payer: Self-pay | Source: Ambulatory Visit | Attending: Obstetrics and Gynecology

## 2018-04-28 DIAGNOSIS — K219 Gastro-esophageal reflux disease without esophagitis: Secondary | ICD-10-CM | POA: Diagnosis not present

## 2018-04-28 DIAGNOSIS — N816 Rectocele: Secondary | ICD-10-CM | POA: Diagnosis not present

## 2018-04-28 DIAGNOSIS — Q059 Spina bifida, unspecified: Secondary | ICD-10-CM | POA: Insufficient documentation

## 2018-04-28 DIAGNOSIS — G8220 Paraplegia, unspecified: Secondary | ICD-10-CM | POA: Diagnosis not present

## 2018-04-28 DIAGNOSIS — Z6841 Body Mass Index (BMI) 40.0 and over, adult: Secondary | ICD-10-CM | POA: Insufficient documentation

## 2018-04-28 HISTORY — DX: Unspecified convulsions: R56.9

## 2018-04-28 HISTORY — DX: Dependence on wheelchair: Z99.3

## 2018-04-28 HISTORY — PX: ANTERIOR AND POSTERIOR REPAIR WITH SACROSPINOUS FIXATION: SHX6536

## 2018-04-28 SURGERY — ANTERIOR AND POSTERIOR REPAIR WITH SACROSPINOUS FIXATION
Anesthesia: General

## 2018-04-28 MED ORDER — ALBUTEROL SULFATE (2.5 MG/3ML) 0.083% IN NEBU: 2.5000 mg | INHALATION_SOLUTION | Freq: Four times a day (QID) | RESPIRATORY_TRACT | Status: DC | PRN

## 2018-04-28 MED ORDER — ONDANSETRON HCL 4 MG/2ML IJ SOLN
INTRAMUSCULAR | Status: DC | PRN
  Administered 2018-04-28: 4 mg via INTRAVENOUS

## 2018-04-28 MED ORDER — HYDROCODONE-ACETAMINOPHEN 5-325 MG PO TABS
1.0000 | ORAL_TABLET | ORAL | Status: DC | PRN
  Administered 2018-04-29: 1 via ORAL
  Filled 2018-04-28: qty 1

## 2018-04-28 MED ORDER — LACTATED RINGERS IV SOLN
INTRAVENOUS | Status: DC
  Administered 2018-04-28: 13:00:00 via INTRAVENOUS

## 2018-04-28 MED ORDER — MENTHOL 3 MG MT LOZG
1.0000 | LOZENGE | OROMUCOSAL | Status: DC | PRN
Start: 2018-04-28 — End: 2018-04-29
  Administered 2018-04-28: 3 mg via ORAL
  Filled 2018-04-28: qty 9

## 2018-04-28 MED ORDER — LIDOCAINE-EPINEPHRINE (PF) 1 %-1:200000 IJ SOLN
INTRAMUSCULAR | Status: DC | PRN
  Administered 2018-04-28: 7 mL

## 2018-04-28 MED ORDER — DEXAMETHASONE SODIUM PHOSPHATE 10 MG/ML IJ SOLN
INTRAMUSCULAR | Status: DC | PRN
  Administered 2018-04-28: 4 mg via INTRAVENOUS

## 2018-04-28 MED ORDER — LIDOCAINE HCL (CARDIAC) PF 100 MG/5ML IV SOSY
PREFILLED_SYRINGE | INTRAVENOUS | Status: DC | PRN
  Administered 2018-04-28: 100 mg via INTRAVENOUS

## 2018-04-28 MED ORDER — SUCCINYLCHOLINE CHLORIDE 20 MG/ML IJ SOLN
INTRAMUSCULAR | Status: DC | PRN
  Administered 2018-04-28: 80 mg via INTRAVENOUS

## 2018-04-28 MED ORDER — LIDOCAINE-EPINEPHRINE (PF) 1 %-1:200000 IJ SOLN
INTRAMUSCULAR | Status: AC
  Filled 2018-04-28: qty 30

## 2018-04-28 MED ORDER — PHENYLEPHRINE 40 MCG/ML (10ML) SYRINGE FOR IV PUSH (FOR BLOOD PRESSURE SUPPORT)
PREFILLED_SYRINGE | INTRAVENOUS | Status: DC | PRN
  Administered 2018-04-28 (×5): 40 ug via INTRAVENOUS
  Administered 2018-04-28: 80 ug via INTRAVENOUS

## 2018-04-28 MED ORDER — GABAPENTIN 300 MG PO CAPS
300.0000 mg | ORAL_CAPSULE | Freq: Once | ORAL | Status: AC
  Administered 2018-04-28: 300 mg via ORAL

## 2018-04-28 MED ORDER — GLYCOPYRROLATE 0.2 MG/ML IJ SOLN
INTRAMUSCULAR | Status: AC
  Filled 2018-04-28: qty 3

## 2018-04-28 MED ORDER — DIPHENHYDRAMINE HCL 12.5 MG/5ML PO ELIX: 12.5000 mg | ORAL_SOLUTION | Freq: Four times a day (QID) | ORAL | Status: DC | PRN

## 2018-04-28 MED ORDER — ACETAMINOPHEN 325 MG PO TABS: 650.0000 mg | ORAL_TABLET | ORAL | Status: DC | PRN

## 2018-04-28 MED ORDER — PROMETHAZINE HCL 25 MG/ML IJ SOLN: 6.2500 mg | INTRAMUSCULAR | Status: DC | PRN

## 2018-04-28 MED ORDER — NALOXONE HCL 0.4 MG/ML IJ SOLN: 0.4000 mg | INTRAMUSCULAR | Status: DC | PRN

## 2018-04-28 MED ORDER — ESTRADIOL 0.1 MG/GM VA CREA
TOPICAL_CREAM | VAGINAL | Status: AC
  Filled 2018-04-28: qty 42.5

## 2018-04-28 MED ORDER — ACETAMINOPHEN 10 MG/ML IV SOLN: 1000.0000 mg | Freq: Once | INTRAVENOUS | Status: DC | PRN

## 2018-04-28 MED ORDER — MEPERIDINE HCL 25 MG/ML IJ SOLN: 6.2500 mg | INTRAMUSCULAR | Status: DC | PRN

## 2018-04-28 MED ORDER — HYDROCODONE-ACETAMINOPHEN 7.5-325 MG PO TABS: 1.0000 | ORAL_TABLET | Freq: Once | ORAL | Status: DC | PRN

## 2018-04-28 MED ORDER — GENTAMICIN SULFATE 40 MG/ML IJ SOLN
5.0000 mg/kg | INTRAVENOUS | Status: DC
  Filled 2018-04-28: qty 10.75

## 2018-04-28 MED ORDER — PROPOFOL 10 MG/ML IV BOLUS
INTRAVENOUS | Status: AC
  Filled 2018-04-28: qty 20

## 2018-04-28 MED ORDER — MIDAZOLAM HCL 2 MG/2ML IJ SOLN
INTRAMUSCULAR | Status: AC
  Filled 2018-04-28: qty 2

## 2018-04-28 MED ORDER — MIDAZOLAM HCL 2 MG/2ML IJ SOLN
INTRAMUSCULAR | Status: DC | PRN
  Administered 2018-04-28 (×2): 1 mg via INTRAVENOUS

## 2018-04-28 MED ORDER — EPHEDRINE 5 MG/ML INJ
INTRAVENOUS | Status: AC
  Filled 2018-04-28: qty 10

## 2018-04-28 MED ORDER — SUCCINYLCHOLINE CHLORIDE 200 MG/10ML IV SOSY
PREFILLED_SYRINGE | INTRAVENOUS | Status: AC
  Filled 2018-04-28: qty 10

## 2018-04-28 MED ORDER — PREDNISONE 20 MG PO TABS
40.0000 mg | ORAL_TABLET | Freq: Every day | ORAL | Status: DC
  Administered 2018-04-28 – 2018-04-29 (×2): 40 mg via ORAL
  Filled 2018-04-28 (×2): qty 2

## 2018-04-28 MED ORDER — SODIUM CHLORIDE 0.9% FLUSH: 9.0000 mL | INTRAVENOUS | Status: DC | PRN

## 2018-04-28 MED ORDER — LIDOCAINE HCL (CARDIAC) PF 100 MG/5ML IV SOSY
PREFILLED_SYRINGE | INTRAVENOUS | Status: AC
  Filled 2018-04-28: qty 5

## 2018-04-28 MED ORDER — HYDROMORPHONE HCL 1 MG/ML IJ SOLN: 0.2500 mg | INTRAMUSCULAR | Status: DC | PRN

## 2018-04-28 MED ORDER — KETOROLAC TROMETHAMINE 30 MG/ML IJ SOLN
INTRAMUSCULAR | Status: AC
  Filled 2018-04-28: qty 1

## 2018-04-28 MED ORDER — SCOPOLAMINE 1 MG/3DAYS TD PT72
1.0000 | MEDICATED_PATCH | Freq: Once | TRANSDERMAL | Status: DC
  Administered 2018-04-28: 1.5 mg via TRANSDERMAL

## 2018-04-28 MED ORDER — HYDROMORPHONE 1 MG/ML IV SOLN
INTRAVENOUS | Status: DC
  Administered 2018-04-28: 11:00:00 via INTRAVENOUS
  Filled 2018-04-28: qty 25

## 2018-04-28 MED ORDER — LACTATED RINGERS IV SOLN
INTRAVENOUS | Status: DC
  Administered 2018-04-28 (×2): via INTRAVENOUS

## 2018-04-28 MED ORDER — DEXAMETHASONE SODIUM PHOSPHATE 4 MG/ML IJ SOLN
INTRAMUSCULAR | Status: AC
  Filled 2018-04-28: qty 1

## 2018-04-28 MED ORDER — ONDANSETRON HCL 4 MG/2ML IJ SOLN
INTRAMUSCULAR | Status: AC
  Filled 2018-04-28: qty 2

## 2018-04-28 MED ORDER — FENTANYL CITRATE (PF) 100 MCG/2ML IJ SOLN
INTRAMUSCULAR | Status: DC | PRN
  Administered 2018-04-28: 100 ug via INTRAVENOUS

## 2018-04-28 MED ORDER — ROCURONIUM BROMIDE 100 MG/10ML IV SOLN
INTRAVENOUS | Status: AC
  Filled 2018-04-28: qty 1

## 2018-04-28 MED ORDER — PHENYLEPHRINE 40 MCG/ML (10ML) SYRINGE FOR IV PUSH (FOR BLOOD PRESSURE SUPPORT)
PREFILLED_SYRINGE | INTRAVENOUS | Status: AC
  Filled 2018-04-28: qty 10

## 2018-04-28 MED ORDER — PROPOFOL 10 MG/ML IV BOLUS
INTRAVENOUS | Status: DC | PRN
  Administered 2018-04-28: 160 mg via INTRAVENOUS

## 2018-04-28 MED ORDER — EPHEDRINE SULFATE 50 MG/ML IJ SOLN
INTRAMUSCULAR | Status: DC | PRN
  Administered 2018-04-28 (×3): 5 mg via INTRAVENOUS

## 2018-04-28 MED ORDER — ONDANSETRON HCL 4 MG/2ML IJ SOLN: 4.0000 mg | Freq: Four times a day (QID) | INTRAMUSCULAR | Status: DC | PRN

## 2018-04-28 MED ORDER — ALBUTEROL SULFATE HFA 108 (90 BASE) MCG/ACT IN AERS: 2.0000 | INHALATION_SPRAY | Freq: Four times a day (QID) | RESPIRATORY_TRACT | Status: DC | PRN

## 2018-04-28 MED ORDER — METRONIDAZOLE IN NACL 5-0.79 MG/ML-% IV SOLN
500.0000 mg | INTRAVENOUS | Status: AC
  Administered 2018-04-28: 500 mg via INTRAVENOUS
  Filled 2018-04-28: qty 100

## 2018-04-28 MED ORDER — ONDANSETRON HCL 4 MG PO TABS: 4.0000 mg | ORAL_TABLET | Freq: Four times a day (QID) | ORAL | Status: DC | PRN

## 2018-04-28 MED ORDER — GABAPENTIN 300 MG PO CAPS
ORAL_CAPSULE | ORAL | Status: AC
Start: 2018-04-28 — End: 2018-04-28
  Administered 2018-04-28: 300 mg via ORAL
  Filled 2018-04-28: qty 1

## 2018-04-28 MED ORDER — DIPHENHYDRAMINE HCL 50 MG/ML IJ SOLN: 12.5000 mg | Freq: Four times a day (QID) | INTRAMUSCULAR | Status: DC | PRN

## 2018-04-28 MED ORDER — SCOPOLAMINE 1 MG/3DAYS TD PT72
MEDICATED_PATCH | TRANSDERMAL | Status: AC
  Administered 2018-04-28: 1.5 mg via TRANSDERMAL
  Filled 2018-04-28: qty 1

## 2018-04-28 MED ORDER — FENTANYL CITRATE (PF) 250 MCG/5ML IJ SOLN
INTRAMUSCULAR | Status: AC
  Filled 2018-04-28: qty 5

## 2018-04-28 SURGICAL SUPPLY — 33 items
BLADE SURG 15 STRL LF C SS BP (BLADE) ×1 IMPLANT
BLADE SURG 15 STRL SS (BLADE) ×2
BNDG GAUZE ELAST 4 BULKY (GAUZE/BANDAGES/DRESSINGS) ×3 IMPLANT
CATH ROBINSON RED A/P 16FR (CATHETERS) ×1 IMPLANT
CATH SILICONE 16FRX5CC (CATHETERS) ×2 IMPLANT
DECANTER SPIKE VIAL GLASS SM (MISCELLANEOUS) ×3 IMPLANT
DERMABOND ADVANCED (GAUZE/BANDAGES/DRESSINGS) ×2
DERMABOND ADVANCED .7 DNX12 (GAUZE/BANDAGES/DRESSINGS) ×1 IMPLANT
DEVICE CAPIO SLIM SINGLE (INSTRUMENTS) ×3 IMPLANT
GLOVE BIO SURGEON STRL SZ7 (GLOVE) ×1 IMPLANT
GLOVE BIOGEL PI IND STRL 6.5 (GLOVE) ×1 IMPLANT
GLOVE BIOGEL PI IND STRL 7.0 (GLOVE) ×1 IMPLANT
GLOVE BIOGEL PI INDICATOR 6.5 (GLOVE) ×2
GLOVE BIOGEL PI INDICATOR 7.0 (GLOVE) ×2
GLOVE SURG SS PI 7.0 STRL IVOR (GLOVE) ×4 IMPLANT
GLOVE SURG SS PI 8.0 STRL IVOR (GLOVE) ×2 IMPLANT
GOWN STRL REUS W/TWL LRG LVL3 (GOWN DISPOSABLE) ×12 IMPLANT
NDL MAYO 6 CRC TAPER PT (NEEDLE) ×1 IMPLANT
NEEDLE HYPO 22GX1.5 SAFETY (NEEDLE) ×3 IMPLANT
NEEDLE MAYO 6 CRC TAPER PT (NEEDLE) ×3 IMPLANT
NS IRRIG 1000ML POUR BTL (IV SOLUTION) ×3 IMPLANT
PACK VAGINAL WOMENS (CUSTOM PROCEDURE TRAY) ×3 IMPLANT
SET CYSTO W/LG BORE CLAMP LF (SET/KITS/TRAYS/PACK) ×3 IMPLANT
SUT CAPIO POLYGLYCOLIC (SUTURE) ×3 IMPLANT
SUT CHROMIC 0 SH (SUTURE) ×2 IMPLANT
SUT MON AB 3-0 SH 27 (SUTURE)
SUT MON AB 3-0 SH27 (SUTURE) IMPLANT
SUT VIC AB 2-0 CT1 27 (SUTURE)
SUT VIC AB 2-0 CT1 TAPERPNT 27 (SUTURE) IMPLANT
SUT VIC AB 2-0 CT2 27 (SUTURE) ×18 IMPLANT
TOWEL OR 17X24 6PK STRL BLUE (TOWEL DISPOSABLE) ×6 IMPLANT
TRAY FOLEY BAG SILVER LF 16FR (CATHETERS) ×2 IMPLANT
TRAY FOLEY W/BAG SLVR 14FR (SET/KITS/TRAYS/PACK) ×1 IMPLANT

## 2018-04-28 NOTE — Transfer of Care (Signed)
Immediate Anesthesia Transfer of Care Note  Patient: Carol CowperHeather P Zavala  Procedure(s) Performed: POSTERIOR REPAIR WITH SACROSPINOUS LIGAMENT FIXATION (N/A )  Patient Location: PACU  Anesthesia Type:General  Level of Consciousness: awake, alert  and oriented  Airway & Oxygen Therapy: Patient Spontanous Breathing and Patient connected to nasal cannula oxygen  Post-op Assessment: Report given to RN and Post -op Vital signs reviewed and stable  Post vital signs: Reviewed and stable  Last Vitals:  Vitals Value Taken Time  BP 114/88 04/28/2018  9:10 AM  Temp    Pulse 123 04/28/2018  9:13 AM  Resp 18 04/28/2018  9:13 AM  SpO2 100 % 04/28/2018  9:13 AM  Vitals shown include unvalidated device data.  Last Pain:  Vitals:   04/28/18 0635  TempSrc: Oral  PainSc: 1       Patients Stated Pain Goal: 3 (04/28/18 [id number])  Complications: No apparent anesthesia complications

## 2018-04-28 NOTE — H&P (Signed)
  History and physical exam unchanged 

## 2018-04-28 NOTE — Anesthesia Postprocedure Evaluation (Signed)
Anesthesia Post Note  Patient: Carol CowperHeather P Zavala  Procedure(s) Performed: POSTERIOR REPAIR WITH SACROSPINOUS LIGAMENT FIXATION (N/A )     Patient location during evaluation: PACU Anesthesia Type: General Level of consciousness: awake and alert Pain management: pain level controlled Vital Signs Assessment: post-procedure vital signs reviewed and stable Respiratory status: spontaneous breathing, nonlabored ventilation, respiratory function stable and patient connected to nasal cannula oxygen Cardiovascular status: blood pressure returned to baseline and stable Postop Assessment: no apparent nausea or vomiting Anesthetic complications: no    Last Vitals:  Vitals:   04/28/18 1013 04/28/18 1040  BP: 123/78 (!) 125/94  Pulse: (!) 114 (!) 115  Resp: 17 18  Temp:  37 C  SpO2: 100% 100%    Last Pain:  Vitals:   04/28/18 1040  TempSrc: Oral  PainSc:    Pain Goal: Patients Stated Pain Goal: 3 (04/28/18 0635)               Trevor IhaStephen A Tatjana Turcott

## 2018-04-28 NOTE — Addendum Note (Signed)
Addendum  created 04/28/18 1457 by Elgie CongoMalinova, Diera Wirkkala H, CRNA   Sign clinical note

## 2018-04-28 NOTE — Op Note (Signed)
NAME: Carol Zavala, Carol P. MEDICAL RECORD [id number] ACCOUNT [id number] DATE OF BIRTH:1981/02/16 FACILITY: [facility] LOCATION: [facility] PHYSICIAN:Quantel Mcinturff Lisbeth PlyS. Nichols Corter, MD  OPERATIVE REPORT  DATE OF PROCEDURE:  04/28/2018  PREOPERATIVE DIAGNOSIS:  Pelvic relaxation mainly in the form of a rectocele.  POSTOPERATIVE DIAGNOSIS:  Pelvic relaxation mainly in the form of a rectocele.  PROCEDURE:  Posterior repair and a sacrospinous ligament suspension.  SURGEON:   Juluis MireJohn S. Darcelle Herrada, MD  ASSISTANT:  Candice Campavid Lowe.  ANESTHESIA:  General endotracheal.  ESTIMATED BLOOD LOSS:  Minimal.    PACKS:  Include a vaginal pack.  DRAINS:  Urethral Foley.    INTRAOPERATIVE BLOOD PLACED:  None.  COMPLICATIONS:  None.  INDICATIONS:  Previously dictated.  DESCRIPTION OF PROCEDURE:  The patient was taken to the OR and placed in the supine position.  After satisfactory level of general endotracheal anesthesia was obtained, the patient was placed in the dorsal lithotomy position using Allen stirrups.   Perineum and vagina were prepped with Betadine.  Bladder was in and out catheterization.  The patient was draped in sterile field.  At this point in time, evaluation revealed a very prominent rectocele.  She had very redundant labia.  They were sutured  to the vulvar area to keep them out of the way.  At this point in time, we infiltrated the vagina and perineum with 1% Xylocaine with epinephrine.  We made a V-incision on the perineal body and dissected up to the vaginal opening and then trimmed the  extra skin.  Using both blunt and sharp dissection.  We undermined the vaginal mucosa from the underlying rectum and perirectal fascia.  We did this up to the vaginal apex.  We then continued with blunt and sharp dissection, dissecting the colon and  perirectal tissue away from the overlying vaginal mucosa.  We were able to dissect out on her right side to the sacrospinous ligament.  Using the Capio a suture was placed  through the sacrospinous ligament and secured to the vaginal cuff and held.  We  then brought in the perirectal fascia into the midline with interrupted sutures of 2-0 Vicryl, completely obliterating the rectocele.  We began closing the vaginal mucosa with interrupted sutures of 2-0 Vicryl.  We then tied down the sacrospinous  ligament suspension with excellent apical support of the vaginal cuff.  We continued the reapproximation of the vaginal mucosa to the perineal body.  Perineal body was rebuilt with 2-0 chromic and the skin was closed with interrupted sutures of 2-0  Vicryl.  At this point in time, we had good hemostasis.  The Foley was placed straight drain clear urine.  A vaginal pack was put in place.  The patient was taken out of dorsal lithotomy position, was alert and extubated and transferred to recovery room  in good condition.  Sponge, instrument and needle count was correct by the circulating nurse x2.  AN/NUANCE  D:04/28/2018 T:04/28/2018 JOB:[id number]

## 2018-04-28 NOTE — Anesthesia Procedure Notes (Signed)
Procedure Name: Intubation Date/Time: 04/28/2018 7:44 AM Performed by: Elgie CongoMalinova, Tasfia Vasseur H, CRNA Pre-anesthesia Checklist: Patient identified, Emergency Drugs available, Suction available and Patient being monitored Patient Re-evaluated:Patient Re-evaluated prior to induction Oxygen Delivery Method: Circle system utilized Preoxygenation: Pre-oxygenation with 100% oxygen Induction Type: IV induction Ventilation: Mask ventilation without difficulty and Oral airway inserted - appropriate to patient size Laryngoscope Size: Glidescope Grade View: Grade I Tube type: Oral Number of attempts: 1 Airway Equipment and Method: Rigid stylet Placement Confirmation: ETT inserted through vocal cords under direct vision,  positive ETCO2 and breath sounds checked- equal and bilateral Secured at: 21 cm Tube secured with: Tape Dental Injury: Teeth and Oropharynx as per pre-operative assessment

## 2018-04-28 NOTE — Brief Op Note (Signed)
Patient name Carol Zavala, [name] DICTATION# [id number] CSN# [id number]  [facility]Hilliard Borges S, MD 04/28/2018 9:06 AM

## 2018-04-28 NOTE — Anesthesia Postprocedure Evaluation (Signed)
Anesthesia Post Note  Patient: Carol CowperHeather P Zavala  Procedure(s) Performed: POSTERIOR REPAIR WITH SACROSPINOUS LIGAMENT FIXATION (N/A )     Patient location during evaluation: [facility] Anesthesia Type: General Level of consciousness: awake and alert and oriented Pain management: pain level controlled Vital Signs Assessment: post-procedure vital signs reviewed and stable Respiratory status: spontaneous breathing, nonlabored ventilation, respiratory function stable and patient connected to nasal cannula oxygen Cardiovascular status: stable Postop Assessment: no apparent nausea or vomiting and adequate PO intake Anesthetic complications: no    Last Vitals:  Vitals:   04/28/18 1300 04/28/18 1403  BP: (!) 118/94 109/75  Pulse: (!) 121 (!) 115  Resp: 18 14  Temp: 37.1 C 37.1 C  SpO2: 100% 98%    Last Pain:  Vitals:   04/28/18 1403  TempSrc: Oral  PainSc:    Pain Goal: Patients Stated Pain Goal: 3 (04/28/18 1047)               Susan Arana Hristova

## 2018-04-28 NOTE — Progress Notes (Signed)
Patient ID: Carol CowperHeather P Zavala, female   DOB: 1981/04/07, 37 y.o.   MRN: [id number] AF VSS ABD SOFT GOOD UP  MINIMAL; SPOTTING

## 2018-04-29 ENCOUNTER — Encounter (HOSPITAL_COMMUNITY): Payer: Self-pay | Admitting: Obstetrics and Gynecology

## 2018-04-29 DIAGNOSIS — N816 Rectocele: Secondary | ICD-10-CM | POA: Diagnosis not present

## 2018-04-29 LAB — CBC
HCT: 36.3 % (ref 36.0–46.0)
Hemoglobin: 11.8 g/dL — ABNORMAL LOW (ref 12.0–15.0)
MCH: 28.4 pg (ref 26.0–34.0)
MCHC: 32.5 g/dL (ref 30.0–36.0)
MCV: 87.3 fL (ref 78.0–100.0)
PLATELETS: 248 10*3/uL (ref 150–400)
RBC: 4.16 MIL/uL (ref 3.87–5.11)
RDW: 13.7 % (ref 11.5–15.5)
WBC: 10.5 10*3/uL (ref 4.0–10.5)

## 2018-04-29 NOTE — Progress Notes (Signed)
1 Day Post-Op Procedure(s) (LRB): POSTERIOR REPAIR WITH SACROSPINOUS LIGAMENT FIXATION (N/A)  Subjective: Patient reports tolerating PO.    Objective: I have reviewed patient's vital signs, intake and output and labs.  General: alert GI: soft, non-tender; bowel sounds normal; no masses,  no organomegaly Vaginal Bleeding: minimal  Assessment: s/p Procedure(s) with comments: POSTERIOR REPAIR WITH SACROSPINOUS LIGAMENT FIXATION (N/A) - patient is in wheelchair-paralyzed from the waist down: stable  Plan: Discharge home  LOS: 0 days    Sharmila Wrobleski S 04/29/2018, 8:20 AM

## 2018-04-29 NOTE — Discharge Summary (Signed)
NAME: Carol Zavala, Carol P. MEDICAL RECORD [id number] ACCOUNT [id number] DATE OF BIRTH:12-10-1980 FACILITY: [facility] LOCATIO: [id number] PHYSICIAN:Chukwudi Ewen Lisbeth PlyS. Amyra Vantuyl, MD  DISCHARGE SUMMARY  DATE OF DISCHARGE:  04/28/2018  ADMITTING DIAGNOSES:  Pelvic relaxation, in the form of a rectocele.  DISCHARGE DIAGNOSES:  Pelvic relaxation, in the form of a rectocele.  OPERATIVE PROCEDURE:  Posterior repair of sacrospinous ligament suspension.  For complete history and physical, please see dictated note.  HOSPITAL COURSE:  The patient underwent above-noted surgery.  Her first postop day, she was doing extremely well.  Her postop hemoglobin was 11.8.  Her vaginal pack had been removed.  There was no active bleeding.  Foley was discontinued also.  She was  tolerating her diet well and will be discharged home.  COMPLICATIONS:  None encountered in the hospital.  The patient was discharged home in stable condition.  DISPOSITION:  The patient to avoid heavy lifting and vaginal entrance.  She is to call should there be heavy vaginal bleeding.  Any fever should be reported.  Excessive pain should be reported.  Nausea, vomiting should be reported.  Instructed in signs  and symptoms of deep venous thrombosis and pulmonary embolus.  The office will call her on Monday and arrange followup.  LN/NUANCE D:04/29/2018 T:04/29/2018 JOB:[id number]

## 2018-04-29 NOTE — Discharge Summary (Signed)
Patient name Carol Zavala, [name] DICTATION# [id number] CSN# [id number]  [facility],Shivangi Lutz S, MD 04/29/2018 8:23 AM

## 2018-04-29 NOTE — Progress Notes (Signed)
   04/29/18 0353  Vital Signs  BP 106/77  BP Location Left Arm  Patient Position (if appropriate) Lying  BP Method Automatic  Pulse Rate 91  Resp 16  Temp 98.4 F (36.9 C)  Temp Source Oral  Oxygen Therapy  SpO2 100 %  O2 Device Room Air  Pt c/o of "a little shortness of breath" VS as above. LS clear, no dyspnea noted. Will continue to monitor and assess and call MD if needed

## 2018-04-29 NOTE — Progress Notes (Signed)
Discharge teaching complete with pt. Pt understood all information and did not have any questions. Pt discharged home to family. 

## 2018-12-23 ENCOUNTER — Ambulatory Visit: Payer: BLUE CROSS/BLUE SHIELD | Admitting: Neurology

## 2021-10-25 ENCOUNTER — Telehealth: Payer: BLUE CROSS/BLUE SHIELD | Admitting: Nurse Practitioner

## 2021-10-25 DIAGNOSIS — N3000 Acute cystitis without hematuria: Secondary | ICD-10-CM

## 2021-10-25 NOTE — Progress Notes (Signed)
Based on what you shared with me, I feel your condition warrants further evaluation and I recommend that you be seen in a face to face visit.  Our protocol is for anyone considered at elevated risk is seen in person so that a urine culture can be collected, and we can not provide that service at this time.    NOTE: There will be NO CHARGE for this eVisit   If you are having a true medical emergency please call 911.      For an urgent face to face visit, [place] has six urgent care centers for your convenience:     [facility] [place] [facility] [place] Get Driving Directions [id number] [place], [place] [address]    [facility]) Get Driving Directions [id number] [place], [place] [address]  [facility] [place] [facility]) Get Driving Directions [id number] [address] [place],  [place]  [address]  [facility] Get Driving Directions [id number] [address] [place], [place], [place] [address]   [facility] Get Driving Directions  [id number] [address], [place] [address]   [facility] [place] Get Driving Directions [id number] [address], [place], [place] [address]  Your MyChart E-visit questionnaire answers were reviewed by a board certified advanced clinical practitioner to complete your personal care plan based on your specific symptoms.  Thank you for using e-Visits.

## 2022-02-11 ENCOUNTER — Ambulatory Visit: Payer: No Typology Code available for payment source | Admitting: Neurology

## 2022-02-11 ENCOUNTER — Encounter: Payer: Self-pay | Admitting: Neurology

## 2022-02-11 VITALS — BP 109/76 | HR 100

## 2022-02-11 DIAGNOSIS — G919 Hydrocephalus, unspecified: Secondary | ICD-10-CM

## 2022-02-11 DIAGNOSIS — R400 Somnolence: Secondary | ICD-10-CM | POA: Diagnosis not present

## 2022-02-11 DIAGNOSIS — G40209 Localization-related (focal) (partial) symptomatic epilepsy and epileptic syndromes with complex partial seizures, not intractable, without status epilepticus: Secondary | ICD-10-CM | POA: Diagnosis not present

## 2022-02-11 MED ORDER — ZONISAMIDE 100 MG PO CAPS: ORAL_CAPSULE | ORAL | 3 refills | Status: DC

## 2022-02-11 NOTE — Progress Notes (Signed)
? ?NEUROLOGY CONSULTATION NOTE ? ?Carol Zavala ?MRN: [id number] ?DOB: [birth year] ? ?Referring provider: [name], PA-C ?Primary care provider: [name], PA-C ? ?Reason for consult:  seizure recurrence ? ?Thank you for your kind referral of Carol Zavala for consultation of the above symptoms. Although her history is well known to you, please allow me to reiterate it for the purpose of our medical record. The patient was accompanied to the clinic by her fiance [name] who also provides collateral information. Records and images were personally reviewed where available. ? ? ?HISTORY OF PRESENT ILLNESS: ?This is a pleasant 41 year old left-handed woman with a history of congenital hydrocephalus and spina bifida s/p VP shunt in infancy and several revisions, and seizures, presenting due to seizure recurrence. She was seen in our office from 2015 to 2019. She had 2 nocturnal convulsions in 2011, then another in 2015. EEG in 2015 was normal. She was started on Levetiracetam but had worsening headaches and was switched to Zonisamide which she tolerated at 200mg  qhs. Since she was seizure-free for 4 years, we repeated another EEG in 01/2018 which did not show any epileptiform discharges, there was breach artifact over the left temporal region. After discussion of risks, she opted to wean off Zonisamide and did well for another 4 years until she had another nocturnal seizure on 02/01/22, 02/03/22 woke up her shaking, stiff, with arms flexed to her chest, foaming at the mouth for 10-15 minutes. No tongue bite or incontinence. There was no prior warning, she felt fine when she went to bed, no prior sleep deprivation, alcohol use, no new medications recently started. She started amlodipine 5 days ago and has noticed twice that there is a smell and taste in the back of her throat. She denies any rising epigastric sensation, focal numbness/tingling/weakness, myoclonic jerks. She continues to have a lot of  headaches, similar to prior visit in 2019, but she feels they have seemed to come more frequently since she had the COVID vaccine. She wakes up with a headache and has them on a daily basis, no nausea/vomiting, visual obscurations. She takes Excedrin only when it is really bad, around once a month. She has intermittent vertigo with spinning sensation especially when laying down and rolling on the bed. Their new pillows seem to help. She also thinks her allergies are contributing. She has neck pain .She fell out of her chair over a week ago, no injuries. They both suspect she has sleep apnea, she gets 7 hours of sleep bu is always exhausted and drowsy during the day. She snores loudly and wakes up catching her breath. A home sleep study done in home sleep study done 2018 did not show significant sleep apnea. It was a normal study, with a diagnosis of Primary Snoring. She reports she only slept an hour and feels it was not accurate. 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 ? ? ?History on Initial Assessment 02/2014: This is a pleasant 41 yo LH woman with a history of congenital hydrocephalus and spina bifida s/p VP shunt in infancy and several revisions, with a history of 2 nocturnal convulsions that occurred in 2011 and most recently last 02/12/2014. Both were witnessed by her husband. In 2011, they were asleep when she started having whole body shaking. Her husband called her father who lives 20 minutes away, told him she was snoring loudly and could not be aroused. When he arrived, she was sitting up, out of it  and confused. She had seen neurologist Dr. [name], records unavailable for review. According to the patient, she was unable to complete the MRI brain but the partial results were "okay." Her routine EEG was reported normal. She was not started on medication at that time and per patient, a sleep study was considered. There were no further similar symptoms until May 2015, out  of sleep when her husband woke up to whole body shaking, drooling and foaming at the mouth. She was confused after and did not know his name, saying "leave me alone," fighting him. Her father arrived 20 minutes later and found her sitting up in the bed, looking around, no focal arm weakness noted. She bit both sides of her tongue. Her husband reported the episode lasted 15 minutes and was worse than the last one. She recalls having 1 alcoholic drink the night prior to the seizures. ? ?She has "always" had headaches, no change in pattern of frontal pressure, with no associated nausea, vomiting, photo/phonophobia, vision changes. She and her father deny any staring/unresponsive episodes, gaps in time, olfactory/gustatory hallucinations, deja vu, rising epigastric sensation, focal numbness/tingling, myoclonic jerks. 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 No change from baseline. She initially had a VP shunt on the right, then had several revisions due to shunt infection, last shunt placed on the left.  ? ?Epilepsy Risk Factors: Congenital hydrocephalus s/p VP shunt. There is no history of febrile convulsions, CNS infections such as meningitis/encephalitis, significant traumatic brain injury, or family history of seizures.  ? 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 There is a shunt catheter seen within the left lateral ventricle entering the posterior aspect of the left parietal bone. The ventricles are normal in caliber. There has been a previous right parietal craniotomy where there is some adjacent right parietal encephalomalacia. There is diffuse thickening of the calvarium. No acute abnormality seen. ?Routine EEG 03/2014: normal wake and drowsy EEG ?1-hour EEG in 01/2018: mildly  abnormal due to breach artifact over the left temporal region ? ? ?PAST MEDICAL HISTORY: ?Past Medical History:  ?Diagnosis Date  ? Anxiety   ? no meds  ? Asthma   ? rarely uses inhaler, just when sick and during allergy season  ? Bladder infection   ? has to do self cath every 4 hours  ? Chronic kidney disease   ? cath self every 4 hours  ? GERD (gastroesophageal reflux disease)   ? diet controlled, no meds currently  ? Headache   ? otc prn  ? Hydrocephalus, congenital (HCC)   ? Neuromuscular disorder (HCC)   ? spina bifida  ? Peripheral vascular disease (HCC)   ? lower legs  ? Seizures (HCC)   ? last seizures 2-3 years ago, stopped treatment 4 months ago per MD, no problems since tx stopped  ? Spina bifida (HCC)   ? Wheelchair bound   ? paralysis LLE, can transfer from w/c to bed  ? ? ?PAST SURGICAL HISTORY: ?Past Surgical History:  ?Procedure Laterality Date  ? ABDOMINAL HYSTERECTOMY    ? ANTERIOR AND POSTERIOR REPAIR WITH SACROSPINOUS FIXATION N/A 04/28/2018  ? Procedure: POSTERIOR REPAIR WITH SACROSPINOUS LIGAMENT FIXATION;  Surgeon: Richardean Chimera, MD;  Location: [facility];  Service: Gynecology;  Laterality: N/A;  patient is in wheelchair-paralyzed from the  waist down  ? APPENDECTOMY    ? BACK SURGERY    ? 89 or 41 years old - w rods , then surgery to remove rods  ? club feet Bilateral   ? age 35  ? pressure sore    ? surgery for pressure sore -buttocks  ? SHUNT REVISION Bilateral   ? age 76  ? SPINE SURGERY    ? TONSILLECTOMY AND ADENOIDECTOMY    ? TONSILLECTOMY AND ADENOIDECTOMY    ? VENTRICULOPERITONEAL SHUNT Left shortly after birth  ? had several revisions  ? WISDOM TOOTH EXTRACTION    ? ? ?MEDICATIONS: ?Current Outpatient Medications on File Prior to Visit  ?Medication Sig Dispense Refill  ? albuterol (PROVENTIL HFA;VENTOLIN HFA) 108 (90 Base) MCG/ACT inhaler Inhale 2 puffs into the lungs every 6 (six) hours as needed for wheezing or shortness of breath (allergies).    ? albuterol (PROVENTIL) (2.5 MG/3ML) 0.083%  nebulizer solution Take 3 mLs (2.5 mg total) by nebulization every 6 (six) hours as needed for wheezing or shortness of breath. 150 mL 1  ? diphenhydrAMINE (BENADRYL) 25 MG tablet Take 25 mg by mouth every 8

## 2022-02-11 NOTE — Patient Instructions (Addendum)
Good to see you. ? ?Schedule head CT without contrast ? ?2. Schedule in-lab sleep study ? ?3. Start Zonisamide 100mg : Take 1 capsule every night for 2 weeks, then increase to 2 capsules every night and continue ? ?4. Follow-up in 4 months, call for any changes ? ? ?Seizure Precautions: ?1. If medication has been prescribed for you to prevent seizures, take it exactly as directed.  Do not stop taking the medicine without talking to your doctor first, even if you have not had a seizure in a long time.  ? ?2. Avoid activities in which a seizure would cause danger to yourself or to others.  Don't operate dangerous machinery, swim alone, or climb in high or dangerous places, such as on ladders, roofs, or girders.  Do not drive unless your doctor says you may. ? ?3. If you have any warning that you may have a seizure, lay down in a safe place where you can't hurt yourself.   ? ?4.  No driving for 6 months from last seizure, as per [facility].   Please refer to the following link on the [place] website for more information: http://www.epilepsyfoundation.org/answerplace/Social/driving/drivingu.cfm  ? ?5.  Maintain good sleep hygiene. Avoid alcohol. ? ?6.  Contact your doctor if you have any problems that may be related to the medicine you are taking. ? ?7.  Call 911 and bring the patient back to the ED if: ?      ? A.  The seizure lasts longer than 5 minutes.      ? B.  The patient doesn't awaken shortly after the seizure ? C.  The patient has new problems such as difficulty seeing, speaking or moving ? D.  The patient was injured during the seizure ? E.  The patient has a temperature over 102 F (39C) ? F.  The patient vomited and now is having trouble breathing ?      ?We have sent a referral to [facility] for your CT and they will call you directly to schedule your appointment. They are located at [place]. If you need to contact them directly please call  [phone].  ?

## 2022-02-19 ENCOUNTER — Ambulatory Visit
Admission: RE | Admit: 2022-02-19 | Discharge: 2022-02-19 | Disposition: A | Discharge status: Home / Self Care | Payer: No Typology Code available for payment source | Source: Ambulatory Visit | Attending: Neurology | Admitting: Neurology

## 2022-02-20 ENCOUNTER — Telehealth: Payer: Self-pay | Admitting: Neurology

## 2022-02-20 NOTE — Telephone Encounter (Signed)
[phone], Carol Zavala long, they are going to reach out to patient. In network. They will call her and arrange.

## 2022-02-20 NOTE — Telephone Encounter (Signed)
Pt called in wanting to find out about her sleep study to be done in the hospital? She stated she received a call about doing an at home sleep study and they thought something got mixed up with the insurance.

## 2022-02-20 NOTE — Telephone Encounter (Signed)
Checking on this at present

## 2022-03-27 ENCOUNTER — Other Ambulatory Visit: Payer: Self-pay | Admitting: Family Medicine

## 2022-03-27 ENCOUNTER — Other Ambulatory Visit: Payer: Self-pay

## 2022-03-27 ENCOUNTER — Ambulatory Visit (HOSPITAL_BASED_OUTPATIENT_CLINIC_OR_DEPARTMENT_OTHER): Payer: No Typology Code available for payment source | Attending: Neurology | Admitting: Internal Medicine

## 2022-03-27 ENCOUNTER — Telehealth: Payer: Self-pay

## 2022-03-27 VITALS — Ht <= 58 in | Wt 180.0 lb

## 2022-03-27 DIAGNOSIS — R0683 Snoring: Secondary | ICD-10-CM | POA: Diagnosis not present

## 2022-03-27 DIAGNOSIS — R400 Somnolence: Secondary | ICD-10-CM | POA: Insufficient documentation

## 2022-04-04 DIAGNOSIS — R400 Somnolence: Secondary | ICD-10-CM | POA: Diagnosis not present

## 2022-04-04 NOTE — Procedures (Signed)
     Patient Name: Carol Zavala, Carol Zavala Date: 03/27/2022 Gender: Female D.O.B: Jun 06, 1981 Age (years): 41 Referring Provider: Van Clines Height (inches): 57 Interpreting Physician: Jetty Duhamel MD, ABSM Weight (lbs): 180 RPSGT: Lowry Ram BMI: 39 MRN: [id number] Neck Size: 16.00  CLINICAL INFORMATION Sleep Study Type: NPSG Indication for sleep study: Daytime Fatigue, Hypertension, Non-refreshing Sleep, Obesity, Snoring Epworth Sleepiness Score: 11  Most recent polysomnogram dated 11/09/2016 revealed an AHI of 2.0/h and RDI of 2.0/h.  SLEEP STUDY TECHNIQUE As per the AASM Manual for the Scoring of Sleep and Associated Events v2.3 (April 2016) with a hypopnea requiring 4% desaturations.  The channels recorded and monitored were frontal, central and occipital EEG, electrooculogram (EOG), submentalis EMG (chin), nasal and oral airflow, thoracic and abdominal wall motion, anterior tibialis EMG, snore microphone, electrocardiogram, and pulse oximetry.  MEDICATIONS Medications self-administered by patient taken the night of the study : AMLODIPINE, SERTRALINE, ZONISAMIDE  SLEEP ARCHITECTURE The study was initiated at 10:30:42 PM and ended at 5:07:44 AM.  Sleep onset time was 85.0 minutes and the sleep efficiency was 52.5%%. The total sleep time was 208.5 minutes.  Stage REM latency was 213.0 minutes.  The patient spent 12.0%% of the night in stage N1 sleep, 75.8%% in stage N2 sleep, 0.0%% in stage N3 and 12.2% in REM.  Alpha intrusion was absent.  Supine sleep was 0.00%.  RESPIRATORY PARAMETERS The overall apnea/hypopnea index (AHI) was 0.6 per hour. There were 0 total apneas, including 0 obstructive, 0 central and 0 mixed apneas. There were 2 hypopneas and 1 RERAs.  The AHI during Stage REM sleep was 2.4 per hour.  AHI while supine was N/A per hour.  The mean oxygen saturation was 93.7%. The minimum SpO2 during sleep was 90.0%.  moderate snoring was noted  during this study.  CARDIAC DATA The 2 lead EKG demonstrated sinus rhythm. The mean heart rate was 81.7 beats per minute. Other EKG findings include: None.  LEG MOVEMENT DATA The total PLMS were 0 with a resulting PLMS index of 0.0. Associated arousal with leg movement index was 0.0 .  IMPRESSIONS - No significant obstructive sleep apnea occurred during this study (AHI = 0.6/h). - The patient had minimal or no oxygen desaturation during the study (Min O2 = 90.0%) - The patient snored with moderate snoring volume. - No cardiac abnormalities were noted during this study. - Clinically significant periodic limb movements did not occur during sleep. No significant associated arousals.  DIAGNOSIS - Primary Snoring  RECOMMENDATIONS - Manage for snoring and symptoms based on clinical judgment. - Sleep hygiene should be reviewed to assess factors that may improve sleep quality. - Weight management and regular exercise should be initiated or continued if appropriate.  [Electronically signed] 04/04/2022 10:32 AM  Jetty Duhamel MD, ABSM Diplomate, American Board of Sleep Medicine NPI: [id number]                        Jetty Duhamel Diplomate, American Board of Sleep Medicine  ELECTRONICALLY SIGNED ON:  04/04/2022, 10:30 AM [facility] PH: [phone]   FX: [phone] ACCREDITED BY THE AMERICAN ACADEMY OF SLEEP MEDICINE

## 2022-04-06 NOTE — Progress Notes (Signed)
No answer at 4:22am

## 2022-04-10 NOTE — Progress Notes (Deleted)
Cardiology Office Note:    Date:  04/10/2022   ID:  Carol Zavala, DOB 1980/11/16, MRN [id number]  PCP:  [name], PA-C   [facility] Providers Cardiologist:  None { Click to update primary MD,subspecialty MD or APP then REFRESH:1}    Referring MD: [name], PA-C   No chief complaint on file. ***  History of Present Illness:    Carol Zavala is a 41 y.o. female with a hx of ***  Past Medical History:  Diagnosis Date   Anxiety    no meds   Asthma    rarely uses inhaler, just when sick and during allergy season   Bladder infection    has to do self cath every 4 hours   Chronic kidney disease    cath self every 4 hours   GERD (gastroesophageal reflux disease)    diet controlled, no meds currently   Headache    otc prn   Hydrocephalus, congenital (HCC)    Neuromuscular disorder (HCC)    spina bifida   Peripheral vascular disease (HCC)    lower legs   Seizures (HCC)    last seizures 2-3 years ago, stopped treatment 4 months ago per MD, no problems since tx stopped   Spina bifida (HCC)    Wheelchair bound    paralysis LLE, can transfer from w/c to bed    Past Surgical History:  Procedure Laterality Date   ABDOMINAL HYSTERECTOMY     ANTERIOR AND POSTERIOR REPAIR WITH SACROSPINOUS FIXATION N/A 04/28/2018   Procedure: POSTERIOR REPAIR WITH SACROSPINOUS LIGAMENT FIXATION;  Surgeon: Richardean Chimera, MD;  Location: [facility];  Service: Gynecology;  Laterality: N/A;  patient is in wheelchair-paralyzed from the waist down   APPENDECTOMY     BACK SURGERY     37 or 41 years old - w rods , then surgery to remove rods   club feet Bilateral    age 62   pressure sore     surgery for pressure sore -buttocks   SHUNT REVISION Bilateral    age 63   SPINE SURGERY     TONSILLECTOMY AND ADENOIDECTOMY     TONSILLECTOMY AND ADENOIDECTOMY     VENTRICULOPERITONEAL SHUNT Left shortly after birth   had several revisions   WISDOM TOOTH EXTRACTION       Current Medications: No outpatient medications have been marked as taking for the 04/14/22 encounter (Appointment) with Meriam Sprague, MD.     Allergies:   Latex, Neomycin, Amoxicillin, Azithromycin, Ciprofloxacin, Keflex [cephalexin], Other, Septra [sulfamethoxazole-trimethoprim], and Suprax [cefixime]   Social History   Socioeconomic History   Marital status: Single    Spouse name: Not on file   Number of children: Not on file   Years of education: Not on file   Highest education level: Not on file  Occupational History   Not on file  Tobacco Use   Smoking status: Never   Smokeless tobacco: Never  Vaping Use   Vaping Use: Never used  Substance and Sexual Activity   Alcohol use: Yes    Alcohol/week: 4.0 - 5.0 standard drinks of alcohol    Types: 4 - 5 Standard drinks or equivalent per week   Drug use: No   Sexual activity: Yes    Birth control/protection: Surgical    Comment: Hysterectomy  Other Topics Concern   Not on file  Social History Narrative   Left handed    One story home    With boy friend  Caff. 1 or 2 cups daily   Social Determinants of Health   Financial Resource Strain: Not on file  Food Insecurity: Not on file  Transportation Needs: Not on file  Physical Activity: Not on file  Stress: Not on file  Social Connections: Not on file     Family History: The patient's ***family history includes Cancer in her maternal grandfather, maternal grandmother, paternal grandfather, and paternal grandmother; Diabetes in her father; Heart disease in her father; Hyperlipidemia in her father and mother.  ROS:   Please see the history of present illness.    *** All other systems reviewed and are negative.  EKGs/Labs/Other Studies Reviewed:    The following studies were reviewed today: ***  EKG:  EKG is *** ordered today.  The ekg ordered today demonstrates ***  Recent Labs: No results found for requested labs within last 365 days.  Recent Lipid  Panel No results found for: "CHOL", "TRIG", "HDL", "CHOLHDL", "VLDL", "LDLCALC", "LDLDIRECT"   Risk Assessment/Calculations:   {Does this patient have ATRIAL FIBRILLATION?[phone]}       Physical Exam:    VS:  There were no vitals taken for this visit.    Wt Readings from Last 3 Encounters:  03/27/22 180 lb (81.6 kg)  04/28/18 191 lb (86.6 kg)  04/20/18 191 lb (86.6 kg)     GEN: *** Well nourished, well developed in no acute distress HEENT: Normal NECK: No JVD; No carotid bruits LYMPHATICS: No lymphadenopathy CARDIAC: ***RRR, no murmurs, rubs, gallops RESPIRATORY:  Clear to auscultation without rales, wheezing or rhonchi  ABDOMEN: Soft, non-tender, non-distended MUSCULOSKELETAL:  No edema; No deformity  SKIN: Warm and dry NEUROLOGIC:  Alert and oriented x 3 PSYCHIATRIC:  Normal affect   ASSESSMENT:    No diagnosis found. PLAN:    In order of problems listed above:  ***      {Are you ordering a CV Procedure (e.g. stress test, cath, DCCV, TEE, etc)?   Press F2        :[id number]}    Medication Adjustments/Labs and Tests Ordered: Current medicines are reviewed at length with the patient today.  Concerns regarding medicines are outlined above.  No orders of the defined types were placed in this encounter.  No orders of the defined types were placed in this encounter.   There are no Patient Instructions on file for this visit.   Signed, Meriam Sprague, MD  04/10/2022 7:14 AM    [facility]

## 2022-04-14 ENCOUNTER — Encounter: Payer: Self-pay | Admitting: Cardiology

## 2022-04-14 ENCOUNTER — Ambulatory Visit: Payer: No Typology Code available for payment source | Admitting: Cardiology

## 2022-04-14 VITALS — BP 116/84 | HR 99 | Ht <= 58 in

## 2022-04-14 DIAGNOSIS — R072 Precordial pain: Secondary | ICD-10-CM | POA: Diagnosis not present

## 2022-04-14 DIAGNOSIS — R011 Cardiac murmur, unspecified: Secondary | ICD-10-CM | POA: Diagnosis not present

## 2022-04-14 MED ORDER — METOPROLOL TARTRATE 100 MG PO TABS: 100.0000 mg | ORAL_TABLET | Freq: Once | ORAL | 0 refills | Status: AC

## 2022-04-14 NOTE — Patient Instructions (Signed)
Medication Instructions:   Your physician recommends that you continue on your current medications as directed. Please refer to the Current Medication list given to you today.  *If you need a refill on your cardiac medications before your next appointment, please call your pharmacy*   Testing/Procedures:  Your physician has requested that you have an echocardiogram. Echocardiography is a painless test that uses sound waves to create images of your heart. It provides your doctor with information about the size and shape of your heart and how well your heart's chambers and valves are working. This procedure takes approximately one hour. There are no restrictions for this procedure.    Your cardiac CT will be scheduled at one of the below locations:   [facility] [address] [place], [place] [address] [phone]   If scheduled at [facility], please arrive at the [facility] and Children's Entrance (Entrance C2) of [facility] 30 minutes prior to test start time. You can use the FREE valet parking offered at entrance C (encouraged to control the heart rate for the test)  Proceed to the [facility] Radiology Department (first floor) to check-in and test prep.  All radiology patients and guests should use entrance C2 at [facility], accessed from [facility], even though the hospital's physical address listed is [address]    Please follow these instructions carefully (unless otherwise directed):  On the Night Before the Test: Be sure to Drink plenty of water. Do not consume any caffeinated/decaffeinated beverages or chocolate 12 hours prior to your test. Do not take any antihistamines 12 hours prior to your test.   On the Day of the Test: Drink plenty of water until 1 hour prior to the test. Do not eat any food 4 hours prior to the test. You may take your regular medications prior to the test.  Take metoprolol 100 MG  BY MOUTH (Lopressor) two hours prior to test. FEMALES- please wear underwire-free bra if available, avoid dresses & tight clothing  After the Test: Drink plenty of water. After receiving IV contrast, you may experience a mild flushed feeling. This is normal. On occasion, you may experience a mild rash up to 24 hours after the test. This is not dangerous. If this occurs, you can take Benadryl 25 mg and increase your fluid intake. If you experience trouble breathing, this can be serious. If it is severe call 911 IMMEDIATELY. If it is mild, please call our office.   We will call to schedule your test 2-4 weeks out understanding that some insurance companies will need an authorization prior to the service being performed.   For non-scheduling related questions, please contact the cardiac imaging nurse navigator should you have any questions/concerns: Rockwell Alexandria, Cardiac Imaging Nurse Navigator Larey Brick, Cardiac Imaging Nurse Navigator [facility] Direct Office Dial: [phone]   For scheduling needs, including cancellations and rescheduling, please call [place], [phone].   Follow-Up: At [facility], you and your health needs are our priority.  As part of our continuing mission to provide you with exceptional heart care, we have created designated Provider Care Teams.  These Care Teams include your primary Cardiologist (physician) and Advanced Practice Providers (APPs -  Physician Assistants and Nurse Practitioners) who all work together to provide you with the care you need, when you need it.  We recommend signing up for the patient portal called "MyChart".  Sign up information is provided on this  After Visit Summary.  MyChart is used to connect with patients for Virtual Visits (Telemedicine).  Patients are able to view lab/test results, encounter notes, upcoming appointments, etc.  Non-urgent messages can be sent to your provider as well.   To learn  more about what you can do with MyChart, go to [email or web address].    Your next appointment:   1 year(s)  The format for your next appointment:   In Person  Provider:   DR. Shari Prows    Important Information About Sugar

## 2022-04-14 NOTE — Progress Notes (Signed)
Cardiology Office Note:    Date:  04/14/2022   ID:  Carol Zavala, DOB 09-20-1981, MRN [id number]  PCP:  [name], PA-C   [facility] Providers Cardiologist:  None {  Referring MD: [name], PA-C    History of Present Illness:    Carol Zavala is a 40 y.o. female with a hx of anxiety, asthma, spina bifeda, seizure disorder, and congenital hydrocephalus who was referred by [name] for further evaluation of chest pain.  Patient seen by [name] on 03/27/22. Note reviewed. Reported chest pain that radiated to her back prompting referral to Cardiology.  Today, the patient states that she has been having intermittent chest pain that radiates to her back for the past several months. Symptoms are unpredictable and occur randomly. Each episode lasts about with no known triggers or alleviating factors. Has some associated SOB but reports no relief with her inhaler. She reports a very strong history of CV disease including 2 uncles with CAD s/p PCI, father with PPM, maternal GM with CHF, nephew who passed away with MI in 54s.   Patient denies LE edema, orthopnea, PND, lightheadedness, dizziness or significant palpitations  Past Medical History:  Diagnosis Date   Anxiety    no meds   Asthma    rarely uses inhaler, just when sick and during allergy season   Bladder infection    has to do self cath every 4 hours   Chronic kidney disease    cath self every 4 hours   GERD (gastroesophageal reflux disease)    diet controlled, no meds currently   Headache    otc prn   Hydrocephalus, congenital (HCC)    Neuromuscular disorder (HCC)    spina bifida   Peripheral vascular disease (HCC)    lower legs   Seizures (HCC)    last seizures 2-3 years ago, stopped treatment 4 months ago per MD, no problems since tx stopped   Spina bifida (HCC)    Wheelchair bound    paralysis LLE, can transfer from w/c to bed    Past Surgical History:   Procedure Laterality Date   ABDOMINAL HYSTERECTOMY     ANTERIOR AND POSTERIOR REPAIR WITH SACROSPINOUS FIXATION N/A 04/28/2018   Procedure: POSTERIOR REPAIR WITH SACROSPINOUS LIGAMENT FIXATION;  Surgeon: Richardean Chimera, MD;  Location: [facility];  Service: Gynecology;  Laterality: N/A;  patient is in wheelchair-paralyzed from the waist down   APPENDECTOMY     BACK SURGERY     75 or 41 years old - w rods , then surgery to remove rods   club feet Bilateral    age 51   pressure sore     surgery for pressure sore -buttocks   SHUNT REVISION Bilateral    age 32   SPINE SURGERY     TONSILLECTOMY AND ADENOIDECTOMY     TONSILLECTOMY AND ADENOIDECTOMY     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   albuterol (PROVENTIL) (2.5 MG/3ML) 0.083% nebulizer solution Take 3 mLs (2.5 mg total) by nebulization every 6 (six) hours as needed for wheezing or shortness of breath.   amLODipine (NORVASC) 2.5 MG tablet Take 2.5 mg by mouth daily. One  time daily   cholecalciferol (VITAMIN D3) 25 MCG (1000 UNIT) tablet as needed.   diphenhydrAMINE (BENADRYL) 25 MG tablet Take 25 mg by mouth every 8 (eight) hours as needed for itching.   loperamide (IMODIUM) 2 MG capsule Take by mouth as needed for diarrhea or loose stools. 2 caps and with next two stool one each.total of 4 in 24 hours   Magnesium Hydroxide (DULCOLAX PO) Take by mouth daily.   meloxicam (MOBIC) 15 MG tablet Take 15 mg by mouth daily as needed.   metoprolol tartrate (LOPRESSOR) 100 MG tablet Take 1 tablet (100 mg total) by mouth once for 1 dose. Take 90-120 minutes prior to scan.   Multiple Vitamins-Minerals (CENTRUM ADULTS) TABS as needed.   sertraline (ZOLOFT) 25  MG tablet Take 25 mg by mouth daily.   zonisamide (ZONEGRAN) 100 MG capsule Take 1 capsule every night for 2 weeks, then increase to 2 capsules every night and continue (Patient taking differently: daily. Take 1 capsule every night for 2 weeks, then increase to 2 capsules every night and continue)     Allergies:   Latex, Neomycin, Amoxicillin, Azithromycin, Ciprofloxacin, Keflex [cephalexin], Other, Septra [sulfamethoxazole-trimethoprim], and Suprax [cefixime]   Social History   Socioeconomic History   Marital status: Single    Spouse name: Not on file   Number of children: Not on file   Years of education: Not on file   Highest education level: Not on file  Occupational History   Not on file  Tobacco Use   Smoking status: Never   Smokeless tobacco: Never  Vaping Use   Vaping Use: Never used  Substance and Sexual Activity   Alcohol use: Yes    Alcohol/week: 4.0 - 5.0 standard drinks of alcohol    Types: 4 - 5 Standard drinks or equivalent per week   Drug use: No   Sexual activity: Yes    Birth control/protection: Surgical    Comment: Hysterectomy  Other Topics Concern   Not on file  Social History Narrative   Left handed    One story home    With boy friend   Caff. 1 or 2 cups daily   Social Determinants of Health   Financial Resource Strain: Not on file  Food Insecurity: Not on file  Transportation Needs: Not on file  Physical Activity: Not on file  Stress: Not on file  Social Connections: Not on file     Family History: The patient's family history includes Cancer in her maternal grandfather, maternal grandmother, paternal grandfather, and paternal grandmother; Diabetes in her father; Heart disease in her father; Hyperlipidemia in her father and mother.  ROS:   Please see the history of present illness.     All other systems reviewed and are negative.  EKGs/Labs/Other Studies Reviewed:    The following studies were reviewed today: No CV procedures  EKG:    04/14/2022 EKG: Rate 99. Sinus Rhythm.   Recent Labs: No results found for requested labs within last 365 days.  Recent Lipid Panel No results found for: "CHOL", "TRIG", "HDL", "CHOLHDL", "VLDL", "LDLCALC", "LDLDIRECT"   Risk Assessment/Calculations:           Physical Exam:    VS:  BP 116/84   Pulse 99   Ht 4\' 9"  (1.448 m)   SpO2 95%   BMI 38.95 kg/m     Wt Readings from Last 3 Encounters:  03/27/22 180 lb (81.6 kg)  04/28/18 191 lb (86.6 kg)  04/20/18 191 lb (86.6  kg)     GEN:  Well nourished, well developed in no acute distress HEENT: Normal NECK: No JVD; No carotid bruits CARDIAC: RRR, 2/6 systolic murmur, no rubs or gallops RESPIRATORY:  Clear to auscultation without rales, wheezing or rhonchi  ABDOMEN: Soft, non-tender, non-distended MUSCULOSKELETAL:  No edema; No deformity  SKIN: Warm and dry NEUROLOGIC:  Alert and oriented x 3 PSYCHIATRIC:  Normal affect   ASSESSMENT:    1. Precordial pain   2. Murmur    PLAN:    In order of problems listed above:  #Chest Pain: Patient reports intermittent chest pain that has been ongoing for the past 2 months. Symptoms are atypical as they are not exertional in nature or triggered by stress, however, given family history and risk factors, will check coronary CTA for further evaluation. -Check coronary CTA  #Systolic Murmur: -Check TTE for further evaluation  #HTN: -Continue amlodipine 2.5mg  daily  #Seizure Disorder: #Congenital Hydrocephalus: #Spina Bifida: -Management per neuro         Follow up in 1 year  Medication Adjustments/Labs and Tests Ordered: Current medicines are reviewed at length with the patient today.  Concerns regarding medicines are outlined above.  Orders Placed This Encounter  Procedures   CT CORONARY MORPH W/CTA COR W/SCORE W/CA W/CM &/OR WO/CM   Basic metabolic panel   EKG 12-Lead   ECHOCARDIOGRAM COMPLETE   Meds ordered this encounter  Medications   metoprolol tartrate  (LOPRESSOR) 100 MG tablet    Sig: Take 1 tablet (100 mg total) by mouth once for 1 dose. Take 90-120 minutes prior to scan.    Dispense:  1 tablet    Refill:  0    Patient Instructions  Medication Instructions:   Your physician recommends that you continue on your current medications as directed. Please refer to the Current Medication list given to you today.  *If you need a refill on your cardiac medications before your next appointment, please call your pharmacy*   Testing/Procedures:  Your physician has requested that you have an echocardiogram. Echocardiography is a painless test that uses sound waves to create images of your heart. It provides your doctor with information about the size and shape of your heart and how well your heart's chambers and valves are working. This procedure takes approximately one hour. There are no restrictions for this procedure.    Your cardiac CT will be scheduled at one of the below locations:   [facility] [address] [place], [place] [address] [phone]   If scheduled at [facility], please arrive at the [facility] and Children's Entrance (Entrance C2) of [facility] 30 minutes prior to test start time. You can use the FREE valet parking offered at entrance C (encouraged to control the heart rate for the test)  Proceed to the [facility] Radiology Department (first floor) to check-in and test prep.  All radiology patients and guests should use entrance C2 at [facility], accessed from [facility], even though the hospital's physical address listed is [address]    Please follow these instructions carefully (unless otherwise directed):  On the Night Before the Test: Be sure to Drink plenty of water. Do not consume any caffeinated/decaffeinated beverages or chocolate 12 hours prior to your test. Do not take any antihistamines 12 hours prior to your test.   On the Day of the  Test: Drink plenty of water until 1 hour prior to the test. Do not eat  any food 4 hours prior to the test. You may take your regular medications prior to the test.  Take metoprolol 100 MG BY MOUTH (Lopressor) two hours prior to test. FEMALES- please wear underwire-free bra if available, avoid dresses & tight clothing  After the Test: Drink plenty of water. After receiving IV contrast, you may experience a mild flushed feeling. This is normal. On occasion, you may experience a mild rash up to 24 hours after the test. This is not dangerous. If this occurs, you can take Benadryl 25 mg and increase your fluid intake. If you experience trouble breathing, this can be serious. If it is severe call 911 IMMEDIATELY. If it is mild, please call our office.   We will call to schedule your test 2-4 weeks out understanding that some insurance companies will need an authorization prior to the service being performed.   For non-scheduling related questions, please contact the cardiac imaging nurse navigator should you have any questions/concerns: Rockwell Alexandria, Cardiac Imaging Nurse Navigator Larey Brick, Cardiac Imaging Nurse Navigator [facility] Direct Office Dial: [phone]   For scheduling needs, including cancellations and rescheduling, please call [place], [phone].   Follow-Up: At [facility], you and your health needs are our priority.  As part of our continuing mission to provide you with exceptional heart care, we have created designated Provider Care Teams.  These Care Teams include your primary Cardiologist (physician) and Advanced Practice Providers (APPs -  Physician Assistants and Nurse Practitioners) who all work together to provide you with the care you need, when you need it.  We recommend signing up for the patient portal called "MyChart".  Sign up information is provided on this After Visit Summary.  MyChart is used to connect with patients for  Virtual Visits (Telemedicine).  Patients are able to view lab/test results, encounter notes, upcoming appointments, etc.  Non-urgent messages can be sent to your provider as well.   To learn more about what you can do with MyChart, go to [email or web address].    Your next appointment:   1 year(s)  The format for your next appointment:   In Person  Provider:   DR. Shari Prows    Important Information About Sugar          I,Tinashe Williams,acting as a scribe for Meriam Sprague, MD.,have documented all relevant documentation on the behalf of Meriam Sprague, MD,as directed by  Meriam Sprague, MD while in the presence of Meriam Sprague, MD.   I, Meriam Sprague, MD, have reviewed all documentation for this visit. The documentation on 04/14/22 for the exam, diagnosis, procedures, and orders are all accurate and complete.

## 2022-04-17 ENCOUNTER — Encounter: Payer: Self-pay | Admitting: Cardiology

## 2022-04-21 ENCOUNTER — Telehealth: Payer: Self-pay | Admitting: *Deleted

## 2022-04-21 NOTE — Telephone Encounter (Signed)
-----   Message from Lorrin Jackson sent at 04/21/2022 10:24 AM EDT ----- Regarding: ct heart Scheduled 05/07/22 at 4:00

## 2022-04-24 ENCOUNTER — Ambulatory Visit (HOSPITAL_COMMUNITY)
Admission: RE | Admit: 2022-04-24 | Discharge: 2022-04-24 | Disposition: A | Discharge status: Home / Self Care | Payer: No Typology Code available for payment source | Source: Ambulatory Visit | Attending: Cardiology | Admitting: Cardiology

## 2022-04-24 DIAGNOSIS — R011 Cardiac murmur, unspecified: Secondary | ICD-10-CM | POA: Diagnosis not present

## 2022-04-24 DIAGNOSIS — R072 Precordial pain: Secondary | ICD-10-CM | POA: Insufficient documentation

## 2022-04-24 DIAGNOSIS — Q059 Spina bifida, unspecified: Secondary | ICD-10-CM | POA: Insufficient documentation

## 2022-04-24 LAB — ECHOCARDIOGRAM COMPLETE
Area-P 1/2: 3.7 cm2
S' Lateral: 2.5 cm

## 2022-04-24 MED ORDER — PERFLUTREN LIPID MICROSPHERE
1.0000 mL | INTRAVENOUS | Status: AC | PRN
  Administered 2022-04-24: 2 mL via INTRAVENOUS

## 2022-05-07 ENCOUNTER — Other Ambulatory Visit (HOSPITAL_COMMUNITY): Payer: No Typology Code available for payment source

## 2022-05-07 ENCOUNTER — Telehealth (HOSPITAL_COMMUNITY): Payer: Self-pay | Admitting: Emergency Medicine

## 2022-05-07 ENCOUNTER — Other Ambulatory Visit (HOSPITAL_COMMUNITY): Payer: Self-pay

## 2022-05-07 DIAGNOSIS — R072 Precordial pain: Secondary | ICD-10-CM

## 2022-05-07 MED ORDER — IVABRADINE HCL 5 MG PO TABS: 10.0000 mg | ORAL_TABLET | Freq: Once | ORAL | 0 refills | Status: DC

## 2022-05-07 MED ORDER — IVABRADINE HCL 5 MG PO TABS
10.0000 mg | ORAL_TABLET | Freq: Once | ORAL | 0 refills | Status: AC
  Filled 2022-05-07: qty 2, 1d supply, fill #0

## 2022-05-07 NOTE — Telephone Encounter (Signed)
Sending rx to [facility] since her [facility] not have in stock  10mg  ivabradine one time dose for CCTA HR control  RN Navigator Cardiac Imaging [facility] [phone] Office  [phone] Cell

## 2022-05-07 NOTE — Telephone Encounter (Signed)
Reaching out to patient to offer assistance regarding upcoming cardiac imaging study; pt verbalizes understanding of appt date/time, parking situation and where to check in, pre-test NPO status and medications ordered, and verified current allergies; name and call back number provided for further questions should they arise Rockwell Alexandria RN Navigator Cardiac Imaging [facility] [phone] office [phone] cell  Arrival 330 w/c entrance Difficult IV  100mg  metoprolol tartrate + 10mg  ivabradine  Holding amlodipine

## 2022-05-08 ENCOUNTER — Ambulatory Visit (HOSPITAL_COMMUNITY)
Admission: RE | Admit: 2022-05-08 | Discharge: 2022-05-08 | Disposition: A | Discharge status: Home / Self Care | Payer: No Typology Code available for payment source | Source: Ambulatory Visit | Attending: Cardiology | Admitting: Cardiology

## 2022-05-08 DIAGNOSIS — R072 Precordial pain: Secondary | ICD-10-CM | POA: Insufficient documentation

## 2022-05-08 DIAGNOSIS — R011 Cardiac murmur, unspecified: Secondary | ICD-10-CM | POA: Insufficient documentation

## 2022-05-08 MED ORDER — NITROGLYCERIN 0.4 MG SL SUBL
SUBLINGUAL_TABLET | SUBLINGUAL | Status: AC
  Filled 2022-05-08: qty 2

## 2022-05-08 MED ORDER — NITROGLYCERIN 0.4 MG SL SUBL
0.8000 mg | SUBLINGUAL_TABLET | Freq: Once | SUBLINGUAL | Status: AC
  Administered 2022-05-08: 0.8 mg via SUBLINGUAL

## 2022-05-11 ENCOUNTER — Telehealth: Payer: Self-pay | Admitting: Cardiology

## 2022-05-11 NOTE — Telephone Encounter (Signed)
Diane [facility] Radiology called to report the following Impression from Cardiac CT performed on 05/08/22:  "Several peripherally enhancing lesions in the visualized portion of the upper liver, largest measuring 4.8 cm. These are suspicious for hemangiomas, however, other etiologies are not excluded. Recommend definitive characterization with MRI of the abdomen without and with contrast." Will route to Cardiologist to review.

## 2022-05-11 NOTE — Telephone Encounter (Signed)
Diane with [facility] is calling to give a call report on patient's CT.

## 2022-05-12 NOTE — Telephone Encounter (Signed)
I spoke with the pt and she reports that she cannot have an MRI due to metal in her back from a previous procedure... she says she will plan to follow up with her PCP ASAP.. I have routed her CT and notes to [name] PA.

## 2022-05-28 MED ORDER — IOHEXOL 350 MG/ML SOLN
80.0000 mL | Freq: Once | INTRAVENOUS | Status: AC | PRN
  Administered 2022-05-28: 80 mL via INTRAVENOUS

## 2022-05-28 NOTE — Addendum Note (Signed)
Encounter addended by: Idelle Leech L on: 05/28/2022 7:47 AM  Actions taken: Imaging Exam ended, Pharmacy for encounter modified, Order list changed, MAR administration accepted

## 2022-06-15 ENCOUNTER — Ambulatory Visit: Payer: No Typology Code available for payment source | Admitting: Neurology

## 2023-02-09 ENCOUNTER — Other Ambulatory Visit: Payer: Self-pay | Admitting: Neurology

## 2023-03-08 ENCOUNTER — Other Ambulatory Visit: Payer: Self-pay | Admitting: Neurology

## 2023-03-12 ENCOUNTER — Other Ambulatory Visit: Payer: Self-pay | Admitting: Neurology

## 2023-08-13 ENCOUNTER — Ambulatory Visit (HOSPITAL_COMMUNITY): Payer: No Typology Code available for payment source

## 2023-10-05 IMAGING — CT CT HEAD W/O CM
1 series · 15 of 30 positions shown, 19 images · non-contrast
Comparison: Report from head CT 10/24/2001 (images unavailable).

CLINICAL DATA: Provided history: Localization-related symptomatic
epilepsy and epileptic syndromes with complex partial seizures, not
intractable, without status epilepticus. Hydrocephalus, unspecified
type. Epilepsy.



[Series 2: head w/(date) · axial · 0.41mm/px · z∈[+663,+823]mm · 15 of 36 slices shown, 19 images]
[im 2/36  brain]
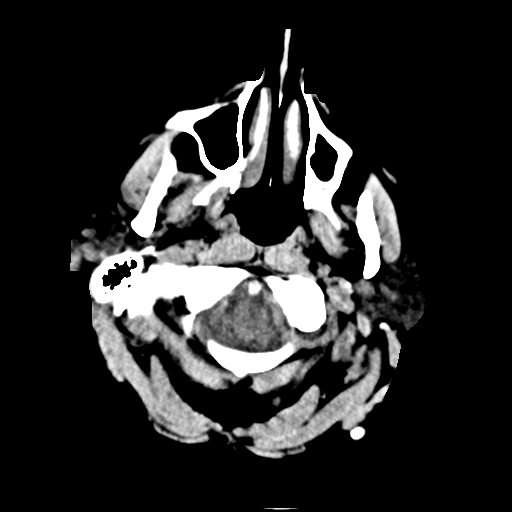
[im 2/36  bone]
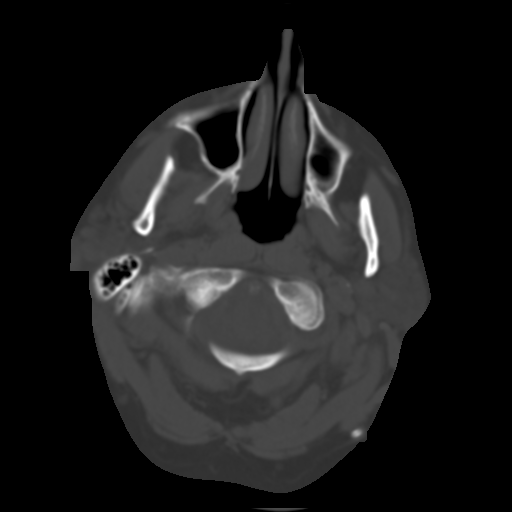
[im 4/36  brain]
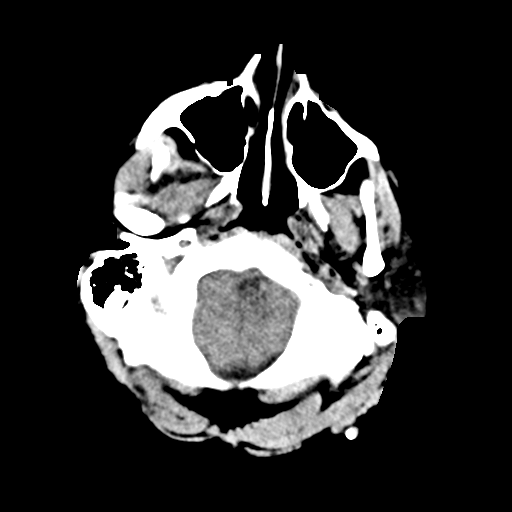
[im 7/36  brain]
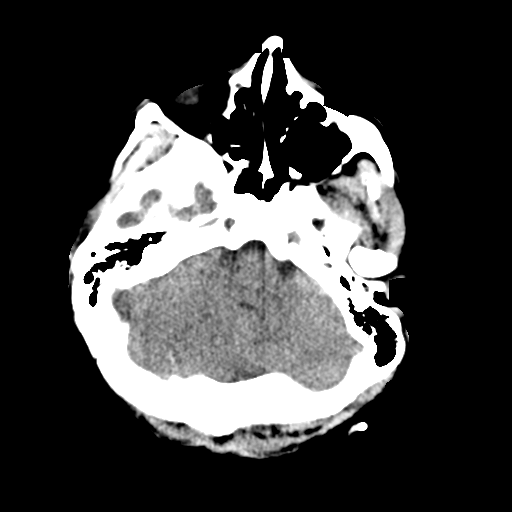
[im 9/36  brain]
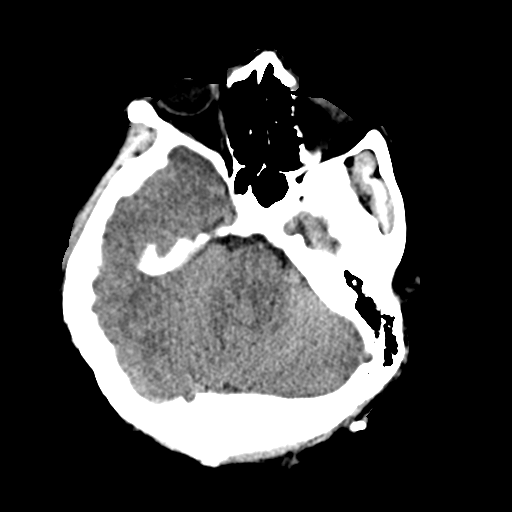
[im 11/36  brain]
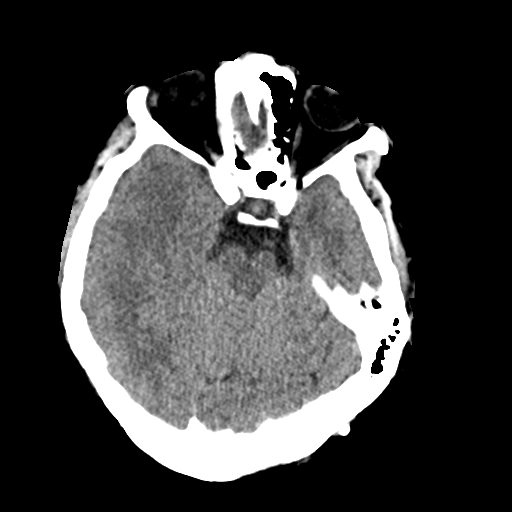
[im 11/36  bone]
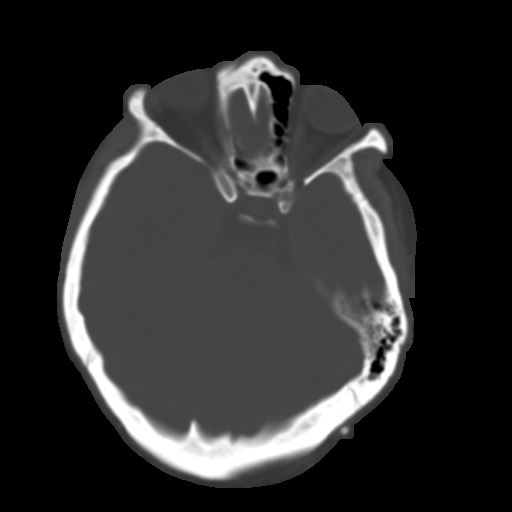
[im 14/36  brain]
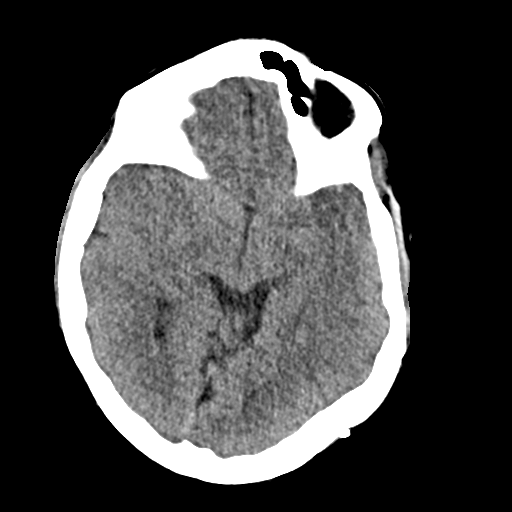
[im 16/36  brain]
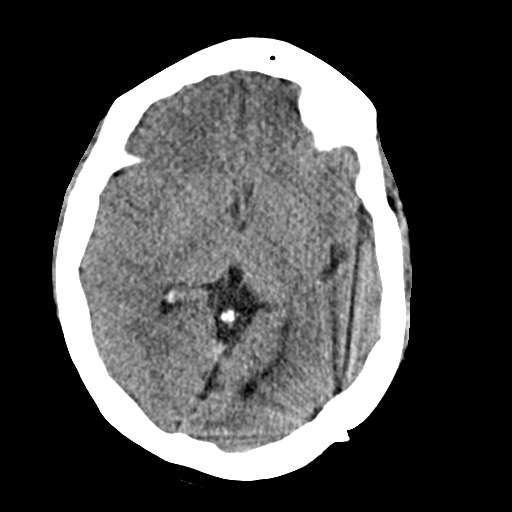
[im 19/36  brain]
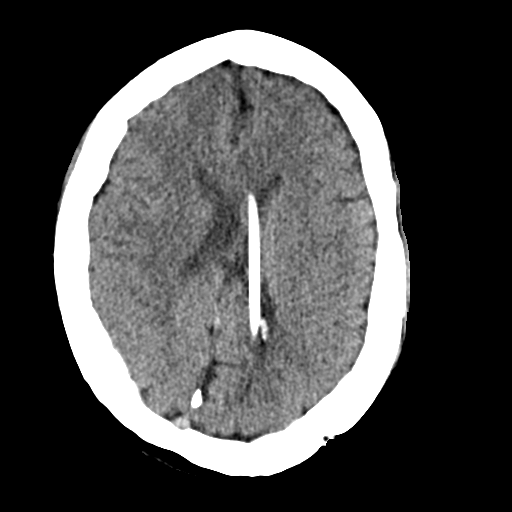
[im 20/36  brain]
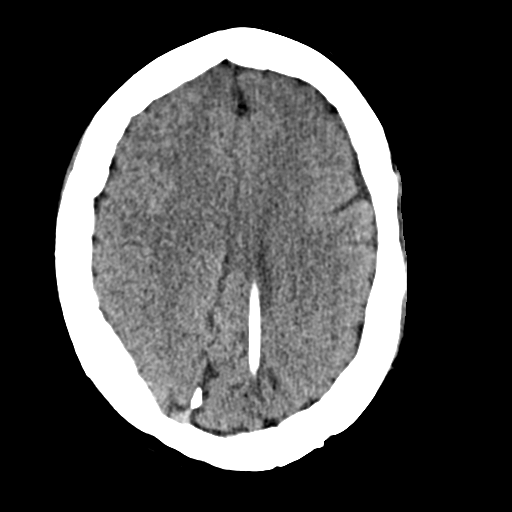
[im 20/36  bone]
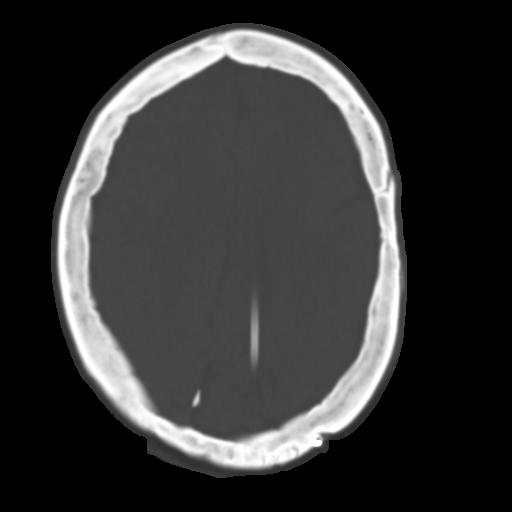
[im 22/36  brain]
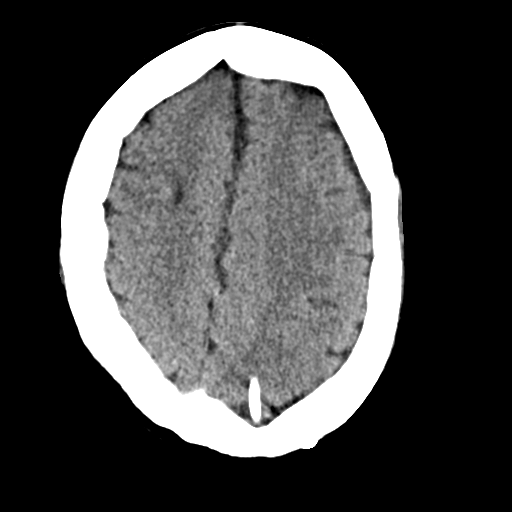
[im 25/36  brain]
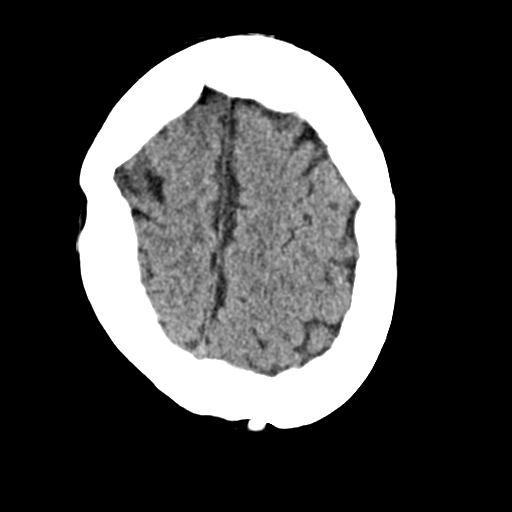
[im 27/36  brain]
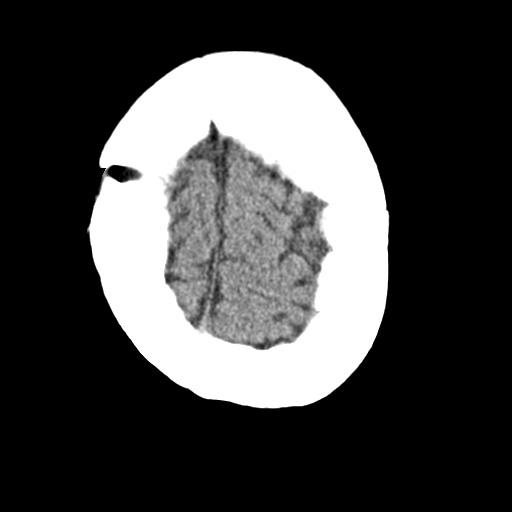
[im 29/36  brain]
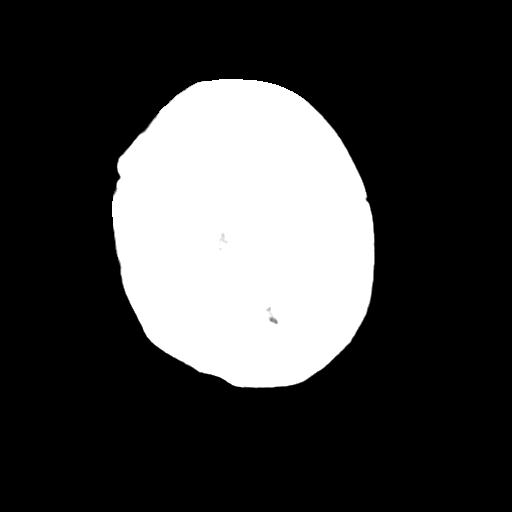
[im 29/36  bone]
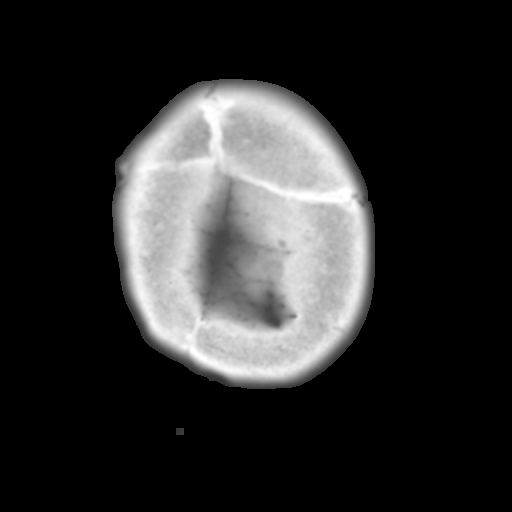
[im 32/36  brain]
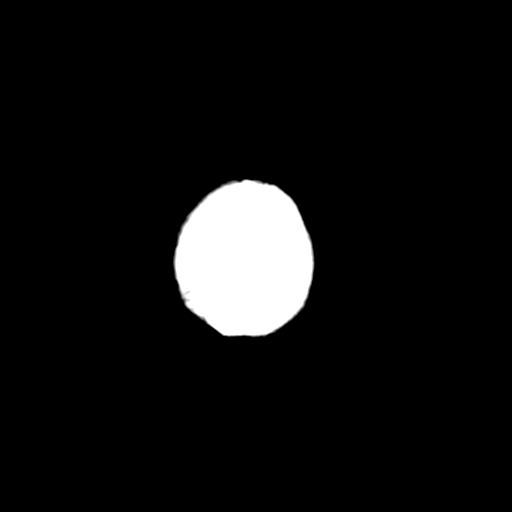
[im 34/36  brain]
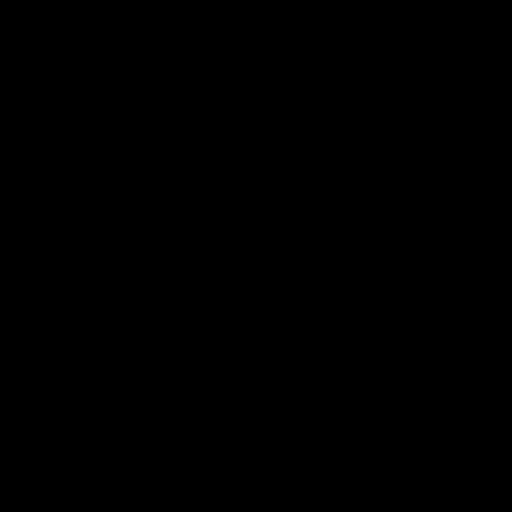

[15 of 30 positions shown; findings below may reference images not displayed]

FINDINGS: Brain:

Left parietal approach ventricular catheter terminating in the
anterior body of the left lateral ventricle. No evidence of
hydrocephalus.

Small focus of chronic encephalomalacia/gliosis within the right
frontal lobe (deep to a right frontoparietal craniotomy).

Chiari 2 malformation with callosal dysgenesis.

There is no acute intracranial hemorrhage.

No acute demarcated cortical infarct.

No extra-axial fluid collection.

No evidence of an intracranial mass.

No midline shift.

Vascular: No hyperdense vessel.

Skull: No acute fracture or aggressive osseous lesion. Right
frontoparietal craniotomy. Left parietal burr hole with traversing
ventricular catheter.

Sinuses/Orbits: No mass or acute finding within the imaged orbits.
No significant paranasal sinus disease.
IMPRESSION: No evidence of acute intracranial abnormality

Left parietal approach ventricular catheter terminating within the
anterior body of the left lateral ventricle. No evidence of
hydrocephalus.

Small focus of chronic encephalomalacia/gliosis within the right
frontal lobe (deep to a right frontoparietal craniotomy).

Chiari 2 malformation with callosal dysgenesis.

## 2024-02-29 ENCOUNTER — Ambulatory Visit: Attending: Family Medicine

## 2024-02-29 ENCOUNTER — Other Ambulatory Visit: Payer: Self-pay

## 2024-02-29 DIAGNOSIS — M6281 Muscle weakness (generalized): Secondary | ICD-10-CM | POA: Insufficient documentation

## 2024-02-29 DIAGNOSIS — R2689 Other abnormalities of gait and mobility: Secondary | ICD-10-CM | POA: Diagnosis present

## 2024-02-29 NOTE — Therapy (Signed)
 OUTPATIENT PHYSICAL THERAPY WHEELCHAIR EVALUATION   Patient Name: Carol Zavala MRN: [id number] DOB:Nov 16, 1980, 43 y.o., female Today's Date: 02/29/2024  END OF SESSION:  PT End of Session - 02/29/24 0931     Visit Number 1    Number of Visits 1    PT Start Time 0930    PT Stop Time 1030    PT Time Calculation (min) 60 min    Equipment Utilized During Treatment Gait belt    Activity Tolerance Patient tolerated treatment well    Behavior During Therapy WFL for tasks assessed/performed             Past Medical History:  Diagnosis Date   Anxiety    no meds   Asthma    rarely uses inhaler, just when sick and during allergy season   Bladder infection    has to do self cath every 4 hours   Chronic kidney disease    cath self every 4 hours   GERD (gastroesophageal reflux disease)    diet controlled, no meds currently   Headache    otc prn   Hydrocephalus, congenital (HCC)    Neuromuscular disorder (HCC)    spina bifida   Peripheral vascular disease (HCC)    lower legs   Seizures (HCC)    last seizures 2-3 years ago, stopped treatment 4 months ago per MD, no problems since tx stopped   Spina bifida (HCC)    Wheelchair bound    paralysis LLE, can transfer from w/c to bed   Past Surgical History:  Procedure Laterality Date   ABDOMINAL HYSTERECTOMY     ANTERIOR AND POSTERIOR REPAIR WITH SACROSPINOUS FIXATION N/A 04/28/2018   Procedure: POSTERIOR REPAIR WITH SACROSPINOUS LIGAMENT FIXATION;  Surgeon: Merryl Abraham, MD;  Location: [facility];  Service: Gynecology;  Laterality: N/A;  patient is in wheelchair-paralyzed from the waist down   APPENDECTOMY     BACK SURGERY     54 or 43 years old - w rods , then surgery to remove rods   club feet Bilateral    age 60   pressure sore     surgery for pressure sore -buttocks   SHUNT REVISION Bilateral    age 13   SPINE SURGERY     TONSILLECTOMY AND ADENOIDECTOMY     TONSILLECTOMY AND ADENOIDECTOMY     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   had several revisions   WISDOM TOOTH EXTRACTION     Patient Active Problem List   Diagnosis Date Noted   Has daytime drowsiness 03/27/2022   Rectocele 04/28/2018   Second degree burn of left thigh 02/02/2018   Acute UTI 05/27/2017   Abnormal urine odor 05/27/2017   General weakness 05/27/2017   Localization-related symptomatic epilepsy and epileptic syndromes with complex partial seizures, not intractable, without status epilepticus (HCC) 04/12/2015   Seizures (HCC) 02/20/2014   Hydrocephalus (HCC) 02/20/2014   Spina bifida (HCC) 02/20/2014    PCP: Darnelle Elders, PA-C  REFERRING PROVIDER: Darnelle Elders, PA-C  THERAPY DIAG:  Other abnormalities of gait and mobility  Muscle weakness (generalized)  Rationale for Evaluation and Treatment Rehabilitation  SUBJECTIVE:  SUBJECTIVE STATEMENT: Pt presents for wheelchair evaluation. Pt was using manual wheelchair in early years but has been using motorized wheelchair for past 20 years. Pt lives with a partner in a house and has built in ramp for entry. Pt reports of L LE edema below the knee. Pt had uterine prolapse in 2010 and rectal prolapse 2013 and she is having hard time reclining in current chair to help with pelvic floor pain. Pt also reports of back pain that 4-5/10 at rest and can get worst 8-9/10. Her current chair is group 2 power chair.  PRECAUTIONS: None  RED FLAGS: None  WEIGHT BEARING RESTRICTIONS No    OCCUPATION: Works at [employer]  PLOF:  Requires assistive device for independence  PATIENT GOALS: obtain an updated power chair         MEDICAL HISTORY:  Primary diagnosis onset: 02/17/2024- date of referral     Medical Diagnosis with ICD-10 code: Q93.9 (ICD-10-CM) - Spina  bifida, unspecified   [] Progressive disease  Relevant future surgeries:     Height: 4\' 9"  Weight: 183 lbs Explain recent changes or trends in weight:      History:  Past Medical History:  Diagnosis Date   Anxiety    no meds   Asthma    rarely uses inhaler, just when sick and during allergy season   Bladder infection    has to do self cath every 4 hours   Chronic kidney disease    cath self every 4 hours   GERD (gastroesophageal reflux disease)    diet controlled, no meds currently   Headache    otc prn   Hydrocephalus, congenital (HCC)    Neuromuscular disorder (HCC)    spina bifida   Peripheral vascular disease (HCC)    lower legs   Seizures (HCC)    last seizures 2-3 years ago, stopped treatment 4 months ago per MD, no problems since tx stopped   Spina bifida (HCC)    Wheelchair bound    paralysis LLE, can transfer from w/c to bed       Cardio Status:  Functional Limitations:   [x] Intact  []  Impaired      Respiratory Status:  Functional Limitations:   [x] Intact  [] Impaired   [] SOB [] COPD [] O2 Dependent ______LPM  [] Ventilator Dependent  Resp equip:                                                     Objective Measure(s):   Orthotics:   [] Amputee:                                                             [] Prosthesis:        HOME ENVIRONMENT:  [x] House [] Condo/town home [] Apartment [] Asst living [] LTCF         [] Own  [x] Rent   [] Lives alone [x] Lives with others -          partner                   Hours without assistance: 24 hours  [x] Home is accessible to patient  Storage of wheelchair:  [x] In home   [] Other Comments:        COMMUNITY :  TRANSPORTATION:  [] Car [] Presenter, broadcasting [] Adapted w/c Lift []  Ambulance [] Other:                     [] Sits in wheelchair during transport   Where is w/c stored during transport?  [] Tie Downs  []  [employer]  r   [] Self-Driver       Drive while in  Biomedical scientist [] yes [x] no    Employment and/or school:  Specific requirements pertaining to mobility        Other:  COMMUNICATION:  Verbal Communication  [x] WFL [] receptive [] WFL [] expressive [] Understandable  [] Difficult to understand  [] non-communicative  Primary Language:__English____________ 2nd:_____________  Communication provided by:[x] Patient [] Family [] Caregiver [] Translator   [] Uses an augmentative communication device     Manufacturer/Model :                                                                MOBILITY/BALANCE:  Sitting Balance  Standing Balance  Transfers  Ambulation   [x] WFL      [] WFL  [x] Independent  []  Independent   [] Uses UE for balance in sitting Comments:  [] Uses UE/device for stability Comments:  []  Min assist  []  Ambulates independently with       device:___________________      []  Mod assist  []  Able to ambulate ______ feet        safely/functionally/independently   []  Min assist  []  Min assist  []  Max assist  []  Non-functional ambulator         History/High risk of falls   []  Mod assist  []  Mod assist  []  Dependent  [x]  Unable to ambulate   []  Max  assist  []  Max assist  Transfer method:[] 1 person [] 2 person [] sliding board [] squat pivot [] stand pivot [] mechanical patient lift  [x] other: scoot transfers from wheelchair to mat table  []  Unable  [x]  Unable    Fall History: # of falls in the past 6 months? 0 # of "near" falls in the past 6 months? 0    CURRENT SEATING / MOBILITY:  Current Mobility Device: [] None [] Cane/Walker [] Manual [] Dependent [] Dependent w/ Tilt rScooter  [x] Power (type of control):   Manufacturer: [employer] 6 Serial #:   Size:  Color:  Age:   Purchased by whom: Advanced  Current condition of mobility base:  9 years ago  Current seating system:         Rehab seating                                                              Age of seating system:  8 years  Describe posture in present seating system:    Is the current mobility meeting medical  necessity?:  [] Yes [x] No Describe: joy stick is sticky and doesn't rebound to center when released, batteries do not hold charge even when replaced, protective foam padding is wearing out and rubber casters in front and back have torn tire treads.  Ability to complete Mobility-Related Activities of Daily Living (MRADL's) with Current Mobility Device:   Move room to room  [x] Independent  [] Min [] Mod [] Max assist  [] Unable  Comments:   Meal prep  [] Independent  [] Min [x] Mod [] Max assist  [] Unable    Feeding  [x] Independent  [] Min [] Mod [] Max assist  [] Unable    Bathing  [x] Independent  [] Min [] Mod [] Max assist  [] Unable    Grooming  [] Independent  [x] Min [] Mod [] Max assist  [] Unable    UE dressing  [] Independent  [] Min [x] Mod [] Max assist  [] Unable    LE dressing  [] Independent   [] Min [x] Mod [] Max assist  [] Unable    Toileting  [x] Independent  [] Min [] Mod [] Max assist  [] Unable    Bowel Mgt: []  Continent [x]  Incontinent []  Accidents [x]  Diapers []  Colostomy []  Bowel Program:  Bladder Mgt: []  Continent [x]  Incontinent []  Accidents [x]  Diapers []  Urinal [x]  Intermittent Cath []  Indwelling Cath []  Supra-pubic Cath     Current Mobility Equipment Trialed/ Ruled Out:    Does not meet mobility needs due to:    Mark all boxes that indicate inability to use the specific equipment listed     Meets needs for safe  independent functional  ambulation  / mobility    Risk of  Falling or History of Falls    Enviromental limitations      Cognition    Safety concerns with  physical ability    Decreased / limitations endurance  & strength     Decreased / limitations  motor skills  & coordination    Pain    Pace /  Speed    Cardiac and/or  respiratory condition    Contra - indicated by diagnosis   Cane/Crutches  []   []   []   []   []   []   []   []   []   []   [x]    Walker / Rollator  []  NA   []   []   []   []   []   []   []   []   []   []   [x]     Manual Wheelchair  [id number]:  [x]  NA  []   []   []   []   []   []   []   []   []   []   []    Manual W/C (K0005) with power assist  [x]  NA  []   []   []   []   []   []   []   []   []   []   []    Scooter  [x]  NA  []   []   []   []   []   []   []   []   []   []   []    Power Wheelchair: standard joystick  []  NA  [x]   []   []   []   []   []   []   []   []   []   []    Power Wheelchair: alternative controls  []  NA  []   []   []   []   []   []   []   []   []   []   []    Summary:  The least costly alternative for independent functional mobility was found to be:    []  Crutch/Cane  []  Walker []  Manual w/c  []  Manual w/c with power assist   []  Scooter   [x]  Power w/c std joystick   []  Power w/c alternative control        []  Requires dependent care mobility device   Cabin crew for [employer] skills are adequate for safe mobility equipment operation  [x]   Yes []   No  Patient is  willing and motivated to use recommended mobility equipment  [x]   Yes []   No       []  Patient is unable to safely operate mobility equipment independently and requires dependent care equipment Comments:           SENSATION and SKIN ISSUES:  Sensation [x]  Intact  []  Impaired []  Absent []  Hyposensate []  Hypersensate  []  Defensiveness  Location(s) of impairment:    Pressure Relief Method(s):  [x]  Lean side to side to offload (without risk of falling)  []   W/C push up (4+ times/hour for 15+ seconds) []  Stand up (without risk of falling)    []  Other: (Describe): Effective pressure relief method(s) above can be performed consistently throughout the day: [] Yes  [x]  No If not, Why?: Pt is not a functional Ambulator and CANNOT functionally stand due to her medical diagnosis. She is only able to perform lean side to side to offload in her current power wheelchair. Pt is unable to perform w/c push ups due to bil shoulder pain.  Skin Integrity Risk:       []  Low risk           []  Moderate risk            [x]  High risk  If high risk, explain:   Skin Issues/Skin  Integrity  Current skin Issues  []  Yes [x]  No [x]  Intact  []   Red area   []   Open area  []  Scar tissue  []  At risk from prolonged sitting  Where: History of Skin Issues  [x]  Yes []  No Where : tail bone When: 10+ years ago Stage:stage IV- required surgery Hx of skin flap surgeries  [x]  Yes []  No Where: tail bone When:10+ years ago  Pain: [x]  Yes []  No   Pain Location(s): bil shoulder (3/10); LBP (5-9/10) Intensity scale: (0-10) : constant lower back pain that varies depending on her activity and sitting; shoulder pain varies depending on activities, transfers, and weather. How does pain interfere with mobility and/or MRADLs? - difficulty with prolonged sitting without rest breaks which affects her function at home and at work; pt has difficulty with performing wheelchair push ups for 15+ seconds effectively for safe off loading due to bil shoulder pain and wrist pain        MAT EVALUATION:  Neuro-Muscular Status: (Tone, Reflexive, Responses, etc.)     []   Intact   []  Spasticity:  [x]  Hypotonicity  in bil LE []  Fluctuating  []  Muscle Spasms  []  Poor Righting Reactions/Poor Equilibrium Reactions  []  Primal Reflex(s):    Comments:            COMMENTS:    POSTURE:     Comments:  Pelvis Anterior/Posterior:  []  Neutral   []  Posterior  []  Anterior  [x]  Fixed - No movement []  Tendency away from neutral []  Flexible []  Self-correction []  External correction Obliquity (viewed from front)  []  WFL [x]  R Obliquity []  L Obliquity  []  Fixed - No movement []  Tendency away from neutral []  Flexible []  Self-correction []  External correction Rotation  []  WFL [x]  R anterior []  L anterior  []  Fixed - No movement []  Tendency away from neutral []  Flexible []  Self-correction []  External correction Tonal Influence Pelvis:  []  Normal []  Flaccid [x]  Low tone []  Spasticity []  Dystonia []  Pelvis thrust []  Other:    Trunk Anterior/Posterior:  []  WFL []  Thoracic  kyphosis []  Lumbar lordosis  [x]  Fixed - No movement []  Tendency away from  neutral []  Flexible []  Self-correction []  External correction  []  WFL []  Convex to left  []  Convex to right []  S-curve   []  C-curve [x]  Multiple curves [x]  Tendency away from neutral []  Flexible []  Self-correction []  External correction Rotation of shoulders and upper trunk:  []  Neutral [x]  Left-anterior []  Right- anterior []  Fixed- no movement []  Tendency away from neutral []  Flexible []  Self correction []  External correction Tonal influence Trunk:  [x]  Normal []  Flaccid []  Low tone []  Spasticity []  Dystonia []  Other:   Head & Neck  [x]  Functional []  Flexed    []  Extended []  Rotated right  []  Rotated left []  Laterally flexed right []  Laterally flexed left []  Cervical hyperextension   [x]  Good head control []  Adequate head control []  Limited head control []  Absent head control Describe tone/movement of head and neck:      Lower Extremity Measurements: LE ROM:  Active ROM Right 02/29/2024 Left 02/29/2024  Hip flexion    Hip extension    Hip abduction    Hip adduction    Knee flexion    Knee extension    Ankle dorsiflexion    Ankle plantarflexion     (Blank rows = not tested)  LE MMT:  MMT Right 02/29/2024 Left 02/29/2024  Hip flexion    Hip extension    Hip abduction    Hip adduction    Knee flexion    Knee extension    Ankle dorsiflexion    Ankle plantarflexion     (Blank rows = not tested)  Hip positions:  []  Neutral   [x]  Abducted   []  Adducted  []  Subluxed   []  Dislocated   []  Fixed   []  Tendency away from neutral [x]  Flexible []  Self-correction [x]  External correction   Hip Windswept:[]  Neutral  [x]  Right    []  Left  []  Subluxed   []  Dislocated   []  Fixed   []  Tendency away from neutral [x]  Flexible []  Self-correction [x]  External correction  LE Tone: []  Normal []  Low tone []  Spasticity [x]  Flaccid []  Dystonia []  Rocks/Extends at hip []  Thrust  into knee extension []  Pushes legs downward into footrest  Foot positioning: ROM Concerns: Dorsiflexed: []  Right   []  Left Plantar flexed: []  Right    []  Left Inversion: []  Right    []  Left Eversion: []  Right    []  Left  LE Edema: []  1+ (Barely detectable impression when finger is pressed into skin) [x]  2+ (slight indentation. 15 seconds to rebound) []  3+ (deeper indentation. 30 seconds to rebound) []  4+ (>30 seconds to rebound)  UE Measurements:  UPPER EXTREMITY ROM:   Active ROM Right 02/29/2024 Left 02/29/2024  Shoulder flexion [facility]  Shoulder abduction [facility]  Shoulder adduction    Elbow flexion    Elbow extension    Wrist flexion    Wrist extension    (Blank rows = not tested)  UPPER EXTREMITY MMT:  MMT Right 02/29/2024 Left 02/29/2024  Shoulder flexion 3+ 3+  Shoulder abduction 4 4  Shoulder adduction    Elbow flexion 5 5  Elbow extension 5 5  Wrist flexion    Wrist extension    Pinch strength    Grip strength    (Blank rows = not tested)  Shoulder Posture:  Right Tendency towards Left  []   Functional []    [x]   Elevation [x]    []   Depression []    []   Protraction []    []   Retraction []    []   Internal rotation []    []   External rotation []    []   Subluxed []     UE Tone: [x]  Normal []  Flaccid []  Low tone []  Spasticity  []  Dystonia []  Other:   UE Edema: [x]  1+ (Barely detectable impression when finger is pressed into skin) []  2+ (slight indentation. 15 seconds to rebound) []  3+ (deeper indentation. 30 seconds to rebound) []  4+ (>30 seconds to rebound)  Wrist/Hand: Handedness: []  Right   [x]  Left   []  NA: Comments:  Right  Left  [x]   WNL [x]    []   Limitations []    []   Contractures []    []   Fisting []    []   Tremors []    []   Weak grasp []    []   Poor dexterity []    []   Hand movement non functional []    []   Paralysis []         MOBILITY BASE RECOMMENDATIONS and JUSTIFICATION:  MOBILITY BASE  JUSTIFICATION   Manufacturer:    quantum Model:           Edge 3                   Color:  Seat Width:  18" Seat Depth 18"   []  Manual mobility base (continue below)   []  Scooter/POV  [x]  Power mobility base   Number of hours per day spent in above selected mobility base: 16+  Typical daily mobility base use Schedule: all day at home and work   [x]  is not a safe, functional ambulator  [x]  limitation prevents from completing a MRADL(s) within a reasonable time frame    [x]  limitation places at high risk of morbidity or mortality secondary to  the attempts to perform a    MRADL(s)  []  limitation prevents accomplishing a MRADL(s) entirely  [x]  provide independent mobility  [x]  equipment is a lifetime medical need  [x]  walker or cane inadequate  [x]  any type manual wheelchair      inadequate  [x]  scooter/POV inadequate      []  requires dependent mobility          MANUAL MOBILITY      []  Standard manual wheelchair  K0001      Arm:    []  both []  right  []  left      Foot:   []  both []  right   []  left  []  self-propels wheelchair  []  will use on regular basis  []  chair fits throughout home  []  willing and motivated to use  []  propels with assistance     []  dependent use   []  Standard hemi-manual wheelchair  K0002      Arm:    []  both []  right  []  left      Foot:   []  both []  right   []  left  []  lower seat height required to foot propel  []  short stature  []  self-propels wheelchair  []  will use on regular basis  []  chair fits throughout home  []  willing and motivated to use   []  propels with assistance  []  dependent use   []  Lightweight manual wheelchair  K0003      Arm:    []  both []  right  []  left      Foot:   []  both  []  right  []  left                   []  hemi height required  []  medical condition and weight  of  wheelchair affect ability to self      propel standard manual wheelchair in the residence  []  can and does self-propel (marginal propulsion skills)  []  daily use _________hours  []  chair  fits throughout home  []  willing and motivated to use  []  lower seat height required to foot propel  []  short stature   []  High strength lightweight manual  wheelchair (Breezy Ultra 4)  K0004     Arm:    []  both []  right  []  left     Foot:   []  both []  right   []  left                                                                  []  hemi height required []  medical condition and weight of wheelchair affect ability to self propel while engaging in frequent MRADL(s) that cannot be performed in a standard or lightweight manual wheelchair  []  daily use _________hours  []  chair fits throughout home  []  willing and motivated to use  []  prevent repetitive use injuries   []  lower seat height required to foot propel  []  short stature    []  Ultra-lightweight manual wheelchair  K0005     Arm:    []  both []  right  []  left     Foot:   []  both []  right  []  left       []  hemi height required  []  heavy duty    Front seat to floor _____ inches      Rear seat to floor _____ inches      Back height _____ inches     Back angle ______ degrees      Front angle _____ degrees  []   full-time manual wheelchair user  []  Requires individualized fitting and optimal adjustments for multiple features that include adjustable axle configuration, fully adjustable center of gravity, wheel camber, seat and back angle, angle of seat slope, which cannot be accommodated by a K0001 through K0004 manual wheelchair  []  prevent repetitive use injuries  []  daily use_________hours   []  user has high activity patterns that frequently require  them  to go out into the community for the purpose of independently accomplishing high level MRADL activities. Examples of these might include a combination of; shopping, work, school, Photographer, childcare, independently loading and unloading from a vehicle etc.  []  lower seat height required to foot propel  []  short stature  []  heavy duty -  weight over 250lbs   []  Current chair is a  K0005   manufacture:___________________  model:_________________  serial#____________________  age:_________    []  First time [id number] user (complete trial)  K0004 time and # of strokes to propel 30 feet: ________seconds _________strokes  U0454 time and # of strokes to propel 30 feet: ________seconds _________strokes  What was the result of the trial between the K0004 and K0005 manual wheelchair? ___    What features of the K0005 w/c are needed as compared to the K0004 base? Why?___    []  adjustable seat and back angle changes the angle of seat slope of the frame to attain a gravity assisted position for efficient propulsion and proper weight distribution along the frame     []  the front of the  wheelchair will be configured higher than the back of the chair to allow gravity to assist the user with postural stability  []  the center of the wheel will be positioned for stability, safety and efficient propulsion  []  adjustable axle allows for vertical, horizontal, camber and overall width changes  throughout the wheels for adjustment of the client's exact needs and abilities.   []  adjustable axle increases the stability and function of the chair allowing for adjustment of the center of gravity.   []  accommodates the client's anatomical position in the chair maximizing independence in mobility and maneuverability in all environments.   []  create a minimal fixed tilt-in space to assist in positioning.   []  Describe users full-time manual wheelchair activity patterns:___    []  Power assist Comments:  []  prevent repetitive use injuries  []  repetitive strain injury present in    shoulder girdle    []  shoulder pain is (> or =) to 7/10     during manual propulsion       Current Pain _____/10  []  requires conservation of energy to participate in MRADL(s) runable to propel up ramps or curbs using manual wheelchair  []  been K0005 user greater than one year  []  user unwilling to use power       wheelchair (reason): []  less expensive option to power   wheelchair   []  rim activated power assist -      decreased strength   []  Heavy duty manual wheelchair       K0006     Arm:    []  both []  right  []  left     Foot:   []  both []  right  []  left     []  hemi height required    []  Dependent base  []  user exceeds 250lbs  []  non-functional ambulator    []  extreme spasticity  []  over active movement   []  broken frame/hx of repeated     repairs  []  able to self-propel in residence       []  lower seat to floor height required  []  unable to self-propel in residence   []  Extra heavy duty manual wheelchair  K0007     Arm:    []  both []  right  []  left     Foot:   []  both []  right  []  left     []  hemi height required  []  Dependent base  []  user exceeds 300lbs  []  non-functional ambulator    []  able to self-propel in residence   []  lower seat to floor height required  []  unable to self-propel in residence     []  Manual wheelchair with tilt [phone]      (Manual "Tilt-n-Space")  []  patient is dependent for transfers  []  patient requires frequent       positioning for pressure relief   []  patient requires frequent      positioning for poor/absent trunk control        []  Stroller Base  []  infant/child   []  unable to propel manual      wheelchair  []  allows for growth  []  non-functional ambulator  []  non-functional UE  []  independent mobility is not a goal at this time    MANUAL FRAME OPTIONS      Push handles  []  extended   []  angle adjustable   []  standard  []  caregiver access  []  caregiver assist    []  allows "hooking" to enable  increased ability to perform ADLs or maintain balance   []  Angle Adjustable Back  []  postural control  []  control of tone/spasticity  []  accommodation of range of motion  []  UE functional control  []  accommodation for seating system    Rear wheel placement  []  std/fixed  [] fully adjustableramputee   []  camber ________degree  []  removable rear wheel   []  non-removable rear wheel  Wheel size _______  Wheel style_______________________  []  improved UE access to wheels  []  increase propulsion ability  []  improved stability  []  changing angle in space for      improvement of postural stability  []  remove for transport    []  allow for seating system to fit on  base  []  amputee placement  []  1-arm drive access   r R  r L  []  enable propulsion of manual       wheelchair with one arm    []  amputee placement   Wheel rims/ Hand rims  []  Standard    []  Specialized-____ []  provide ability to propel manual   []  increase self-propulsion with hand wheelchair weakness/decreased grasp     []  Spoke protector/guard   []  prevent hands from getting caught in spokes   Tires:  []  pneumatic  []  flat free inserts  []  solid  Style:  []  decrease roll resistance              []  prevent frequent flats  []  increase shock absorbency  []  decrease maintenance   []  decrease pain from road shock    []  decrease spasms from road shock    Wheel Locks:    []  push []  pull []  scissor  []  lock wheels for transfers  []  lock wheels from rolling   Brake/wheel lock extension:  []  R  []  L  []  allow user to operate wheel locks due to decreased reach or strength   Caster housing:  Materials engineer size:                      Style:                                          []  suspension fork  []  maneuverability   []  stability of wheelchair   []  durability  []  maintenance  []  angle adjustment for posture  []  allow for feet to come under        wheelchair base  []  allows change in seat to floor  height   []  increase shock absorbency  []  decrease pain from road shock  []  decrease spasms from road    shock   []  Side guards  []  prevent clothing getting caught in wheel or becoming soiled   [] provide hip and pelvic stability  []  eliminates contact between body and wheels  []  limit hand contact with wheels   []  Anti-tippers      []  prevent wheelchair from tipping    backward  []   assist caregiver with curbs     POWER MOBILITY      []  Scooter/POV    []  can safely operate   []  can safely transfer   []  has adequate trunk stability   []  cannot functionally propel  manual wheelchair    [x]  Power mobility base    [x]  non-ambulatory   [x]  cannot functionally propel manual wheelchair   [x]  cannot  functionally and safely      operate scooter/POV  [x]  can safely operate power       wheelchair  [x]  home is accessible  [x]  willing to use power wheelchair     Tilt  [x]  Powered tilt on powered chair  []  Powered tilt on manual chair  []  Manual tilt on manual chair Comments:  [x]  change position for pressure      [x]  elief/cannot weight shift   [x]  change position against      gravitational force on head and      shoulders   [x]  decrease pain  []  blood pressure management   []  control autonomic dysreflexia  []  decrease respiratory distress  []  management of spasticity  [x]  management of low tone  [x]  facilitate postural control   [x]  rest periods   [x]  control edema  [x]  increase sitting tolerance   [x]  aid with transfers     Recline   [x]  Power recline on power chair  []  Manual recline on manual chair  Comments:    [x]  intermittent catheterization  []  manage spasticity  []  accommodate femur to back angle  [x]  change position for pressure relief/cannot weight shift rhigh risk of pressure sore development  [x]  tilt alone does not accomplish     effective pressure relief, maximum pressure relief achieved at -      ____50___ degrees tilt   _____175__ degrees recline   []  difficult to transfer to and from bed [x]  rest periods and sleeping in chair  [x]  repositioning for transfers  [x]  bring to full recline for ADL care  [x]  clothing/diaper changes in chair  []  gravity PEG tube feeding  [x]  head positioning  [x]  decrease pain  []  blood pressure management   []  control autonomic dysreflexia  []  decrease respiratory distress  []  user on ventilator      Elevator on mobility base  [x]  Power wheelchair  []  Scooter  [x]  increase Indep in transfers   [x]  increase Indep in ADLs    [x]  bathroom function and safety  [x]  kitchen/cooking function and safety  [x]  shopping  [x]  raise height for communication at standing level  [x]  raise height for eye contact which reduces cervical neck strain and pain  [x]  drive at raised height for safety and navigating crowds  []  Other:   []  Vertical position system  (anterior tilt)     (Drive locks-out)    []  Stand       (Drive enabled)  []  independent weight bearing  []  decrease joint contractures  []  decrease/manage spasticity  []  decrease/manage spasms  []  pressure distribution away from   scapula, sacrum, coccyx, and ischial tuberosity  []  increase digestion and elimination   []  access to counters and cabinets  []  increase reach  []  increase interaction with others at eye level, reduces neck strain  []  increase performance of       MRADL(s)      Power elevating legrest    [x]  Center mount (Single) 85-170 degrees       []  Standard (Pair) 100-170 degrees  []  position legs at 90 degrees, not available with std power ELR  [x]  center mount tucks into chair to decrease turning radius in home, not available with std power ELR  [x]  provide change in position for LE  [x]  elevate legs during recline    [x]  maintain placement of feet on      footplate  [x]  decrease edema  [x]  improve circulation  [x]   actuator needed to elevate legrest  []  actuator needed to articulate legrest preventing knees from flexing  [x]  Increase ground clearance over      curbs  []   STD (pair) independently                     elevate legrest   POWER WHEELCHAIR CONTROLS      Controls/input device  [x]  Expandable  []  Non-expandable  []  Proportional  []  Right Hand [x]  Left Hand  []  Non-proportional/switches/head-array  []  Electrical/proximity         []   Mechanical      Manufacturer:___________________    Type:________________________ [x]  provides access for controlling wheelchair  [x]  programming for accurate control  []  progressive disease/changing condition  []  required for alternative drive      controls       []  lacks motor control to operate  proportional drive control  []  unable to understand proportional controls  []  limited movement/strength  []  extraneous movement / tremors / ataxic / spastic       [x]  Upgraded electronics controller/harness    []  Single power (tilt or recline)   []  Expandable    []  Non-expandable plus   [x]  Multi-power (tilt, recline, power legrest, power seat lift, vertical positioning system, stand)  [x]  allows input device to communicate with drive motors  [x]  harness provides necessary connections between the controller, input device, and seat functions     [x]  needed in order to operate power seat functions through joystick/ input device  []  required for alternative drive controls     []  Enhanced display  []  required to connect all alternative drive controls   []  required for upgraded joystick      (lite-throw, heavy duty, micro)  []  Allows user to see in which mode and drive the wheelchair is set; necessary for alternate controls       []  Upgraded tracking electronics  []  correct tracking when on uneven surfaces makes switch driving more efficient and less fatiguing  []  increase safety when driving  []  increase ability to traverse thresholds    []  Safety / reset / mode switches     Type:    []  Used to change modes and stop the wheelchair when driving     [x]  Mount for joystick / input device/switches   Non swing away []  swing away for access or transfers   [x]  attaches joystick / input device / switches to wheelchair   [x]  provides for consistent access  [x]  midline for optimal placement    []  Attendant controlled joystick plus     mount  []  safety  []  long distance driving  []  operation of seat functions  []  compliance with transportation  regulations    [x]  Battery NF22 x 2 [x]  required to power (power assist / scooter/ power wc / other):   []  Power inverter (24V to 12V)  []  required for ventilator / respiratory equipment / other:     CHAIR OPTIONS MANUAL & POWER      Armrests   [x]  adjustable height []  removable  []  swing away []  fixed  [x]  flip back  []  reclining  [x]  full length pads []  desk []  tube arms []  gel pads  [x]  provide support with elbow at 90    [x]  remove/flip back/swing away for  transfers  [x]  provide support and positioning of upper body    [x]  allow to come closer to table top  [x]  remove for access to  tables  []  provide support for w/c tray  []  change of height/angles for variable activities   []  Elbow support / Elbow stop  []  keep elbow positioned on arm pad  []  keep arms from falling off arm pad  during tilt and/or recline   Upper Extremity Support  []  Arm trough  []   R  []   L  Style:  []  swivel mount []  fixed mount   []  posterior hand support  []   tray  []  full tray  []  joystick cut out  []   R  []   L  Style:  []  decrease gravitational pull on      shoulders  []  provide support to increase UE  function  []  provide hand support in natural    position  []  position flaccid UE  []  decrease subluxation    []  decrease edema       []  manage spasticity   []  provide midline positioning  []  provide work surface  []  placement for AAC/ Computer/ EADL       Hangers/ Legrests   []  ______ degree  []  Elevating []  articulating  []  swing away []  fixed []  lift off  []  heavy duty  []  adjustable knee angle  []  adjustable calf panel   []  longer extension tube              []  provide LE support  []  maintain placement of feet on      footplate   []  accommodate lower leg length  []  accommodate to hamstring       tightness  []  enable transfers  []  provide change in position for LE's  []  elevate legs during recline    []  decrease edema  []  durability      Foot support   [x]  footplate [x]  R [x]  L [x]   flip up           [x]  Depth adjustable   [x]  angle adjustable  []  foot board/one piece    [x]  Side guards for foot plate [x]  provide foot support  [x]  accommodate to ankle ROM  [x]  allow foot to go under wheelchair base  [x]  enable transfers     []  Shoe holders  []  position foot    []  decrease / manage spasticity  []  control position of LE  []  stability    []  safety     []  Ankle strap/heel      loops  []  support foot on foot support  []  decrease extraneous movement  []  provide input to heel   []  protect foot     []  Amputee adapter []  R  []  L     Style:                  Size:  []  Provide support for stump/residual extremity    []  Transportation tie-down  []  to provide crash tested tie-down brackets    []  Crutch/cane holder    []  O2 holder    []  IV hanger   []  Ventilator tray/mount    []  stabilize accessory on wheelchair       Component  Justification     [x]  Seat cushion   Matrix flovair     [x]  accommodate impaired sensation  []  decubitus ulcers present or history  [x]  unable to shift weight  [x]  increase pressure distribution  [x]  prevent pelvic extension  [x]  custom required "off-the-shelf"    seat cushion will not accommodate deformity  [x]  stabilize/promote pelvis alignment  [x]   stabilize/promote femur alignment  [x]  accommodate obliquity  [x]  accommodate multiple deformity  [x]  incontinent/accidents  [x]  low maintenance     []  seat mounts                 []  fixed []  removable  []  attach seat platform/cushion to wheelchair frame    []  Seat wedge    []  provide increased aggressiveness of seat shape to decrease sliding  down in the seat  []  accommodate ROM        []  Cover replacement   []  protect back or seat cushion  []  incontinent/accidents    []  Solid seat / insert    []  support cushion to prevent      hammocking  []  allows attachment of cushion to mobility base    [x]  Lateral pelvic/thigh/hip     support (Guides)     [x]  decrease abduction  [x]  accommodate  pelvis  []  position upper legs  [x]  accommodate spasticity  [x]  removable for transfers     [x]  Lateral pelvic/thigh      supports mounts  []  fixed   []  swing-away   [x]  removable  [x]  mounts lateral pelvic/thigh supports     [x]  mounts lateral pelvic/thigh supports swing-away or removable for transfers    []  Medial thigh support ([place]mel)  [] decrease adduction  [] accommodate ROM  []  remove for transfers   []  alignment      []  Medial thigh   []  fixed      support mounts      []  swing-away   []  removable  []  mounts medial thigh supports   []  Mounts medial supports swing- away or removable for transfers       Component  Justification   [x]  Back   [employer]     [x]  provide posterior trunk support [x]  facilitate tone  [x]  provide lumbar/sacral support [x]  accommodate deformity  [x]  support trunk in midline   [x]  custom required "off-the-shelf" back support will not accommodate deformity   []  provide lateral trunk support [x]  accommodate or decrease tone            []  Back mounts  []  fixed  []  removable  []  attach back rest/cushion to wheelchair frame   []  Lateral trunk      supports  []  R []  L  []  decrease lateral trunk leaning  []  accommodate asymmetry    []  contour for increased contact  []  safety    []  control of tone    []  Lateral trunk      supports mounts  []  fixed  []  swing-away   []  removable  []  mounts lateral trunk supports     []  Mounts lateral trunk supports swing-away or removable for transfers   []  Anterior chest      strap, vest     []  decrease forward movement of shoulder  []  decrease forward movement of trunk  []  safety/stability  []  added abdominal support  []  trunk alignment  []  assistance with shoulder control   []  decrease shoulder elevation    [x]  Headrest      [x]  provide posterior head support  [x]  provide posterior neck support  []  provide lateral head support  []  provide anterior head support  [x]  support during tilt and recline   []  improve feeding     []  improve respiration  []  placement of switches  []  safety    [x]  accommodate ROM   [x]  accommodate tone  []  improve visual  orientation   [x]  Headrest           []  fixed [x]  removable []  flip down      Mounting hardware   []  swing-away laterals/switches  [x]  mount headrest   []  mounts headrest flip down or  removable for transfers  []  mount headrest swing-away laterals   []  mount switches     []  Neck Support    []  decrease neck rotation  []  decrease forward neck flexion   Pelvic Positioner    [x]  std hip belt          []  padded hip belt  []  dual pull hip belt  []  four point hip belt  []  stabilize tone  [x]  decrease falling out of chair  []  prevent excessive extension  []  special pull angle to control      rotation  []  pad for protection over boney   prominence  []  promote comfort    []  Essential needs        bag/pouch   []  medicines []  special food rorthotics []  clothing changes  []  diapers  []  catheter/hygiene []  ostomy supplies   The above equipment has a life- long use expectancy.  Growth and changes in medical and/or functional conditions would be the exceptions.   SUMMARY:    ASSESSMENT:  CLINICAL IMPRESSION: Patient is a 43 y.o. female who was seen today for physical therapy evaluation and treatment for power mobility wheelchair assessment. Patient has medical diagnosis of spina bifida and has been using power mobility device for 20+ years and was using manual wheelchair prior to transitioning into power mobility assessment. Patient has bowel and bladder incontinent and requires self intermittent catheterization. Patient also has moderate to severe lower back pain.  Currently living situation: patient is living independently at home with a partner. Patient's partner is disabled and also using power mobility device. Patient requires assistance with cooking (for reaching for pots/pads/spices in overhead cabinet- where her partner (with seat elevator)  is able to reach up high and bring it down for her), brushing and reaching for things on countertop. Pt is able to perform chair to bed transfers independently with scooting safely. Pt is also working full time and requires power mobility device for in home and community access.  Current mobility device: Group 2 power chair. Her current chair and seating cushion is 13+ years old and is not meeting her current medical necessarily. Her current device has malfunctioning joy stick that sticks, broken wheel casters, malfunctioning batteries, and broken arm pads. Most of these repairs cannot be done considering age of her current device and lack of support. With her current device, she is not able to recline to off load for effective pressure relief, relieve back pain throughout the day and to perform intermittent craterization.   Patient will benefit from upgrading to Group 3 power wheelchair with seat elevator, tilt and recline functions added to improve her independence and reduce caregiver burden.  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 Patient also reports of scoliosis diagnsis. Patient has pelvis obliquity and hip windswept posturing.  Power recline will allow for improved blood circulation in her legs and assist with managing edema. Power tilt combined with power recline will assist her in with intermittent catheterization. Power tilt and recline will assist her with dressing as well. Power tilt and recline function will help her shift weight to improve back pain  and allow for rest periods within her chair.    OBJECTIVE IMPAIRMENTS Abnormal gait, decreased activity tolerance, decreased balance, decreased endurance, decreased mobility, difficulty walking, decreased ROM, decreased strength, hypomobility, increased edema, increased fascial restrictions, increased muscle spasms, impaired flexibility, impaired sensation, impaired tone, impaired UE functional use, postural dysfunction, and pain.   ACTIVITY LIMITATIONS carrying, lifting, bending, sitting, standing, squatting, stairs, transfers, bed mobility, continence, bathing, toileting, dressing, hygiene/grooming, and caring for others  PARTICIPATION LIMITATIONS: meal prep, cleaning, laundry, interpersonal relationship, shopping, community activity, and occupation  PERSONAL FACTORS Past/current experiences, Time since onset of injury/illness/exacerbation, Transportation, and 1-2 comorbidities: CKD, seizures, spina bifida,  are also affecting patient's functional outcome.   REHAB POTENTIAL: Good  CLINICAL DECISION MAKING: Stable/uncomplicated  EVALUATION COMPLEXITY: Moderate                                   GOALS: One time visit. No goals established.    PLAN: PT FREQUENCY: one time visit    Kristine Phalen, PT 02/29/2024, 9:31 AM    I concur with the above findings and recommendations of the therapist:  Physician name printed:         Physician's signature:      Date:

## 2024-09-08 ENCOUNTER — Other Ambulatory Visit: Payer: Self-pay | Admitting: Neurology
